# Patient Record
Sex: Male | Born: 1950 | Race: White | Hispanic: No | Marital: Married | Smoking: Never smoker
Health system: Southern US, Community
[De-identification: ages and names within clinical notes are randomized; demographics above are authoritative.]

## PROBLEM LIST (undated history)

## (undated) DIAGNOSIS — I10 Essential (primary) hypertension: Secondary | ICD-10-CM

## (undated) DIAGNOSIS — R0600 Dyspnea, unspecified: Secondary | ICD-10-CM

## (undated) DIAGNOSIS — M1990 Unspecified osteoarthritis, unspecified site: Secondary | ICD-10-CM

## (undated) DIAGNOSIS — R51 Headache: Secondary | ICD-10-CM

## (undated) DIAGNOSIS — R519 Headache, unspecified: Secondary | ICD-10-CM

## (undated) DIAGNOSIS — J189 Pneumonia, unspecified organism: Secondary | ICD-10-CM

## (undated) DIAGNOSIS — E119 Type 2 diabetes mellitus without complications: Secondary | ICD-10-CM

## (undated) HISTORY — PX: JOINT REPLACEMENT: SHX530

## (undated) HISTORY — PX: BACK SURGERY: SHX140

## (undated) HISTORY — PX: REPAIR QUADRICEPS / HAMSTRING MUSCLE: SUR1204

## (undated) HISTORY — PX: OTHER SURGICAL HISTORY: SHX169

---

## 2003-07-12 ENCOUNTER — Ambulatory Visit (HOSPITAL_COMMUNITY): Admission: RE | Admit: 2003-07-12 | Discharge: 2003-07-13 | Payer: Self-pay | Admitting: Neurosurgery

## 2010-07-04 ENCOUNTER — Emergency Department: Payer: Self-pay | Admitting: Unknown Physician Specialty

## 2011-08-04 ENCOUNTER — Ambulatory Visit: Payer: Self-pay | Admitting: Endocrinology

## 2011-09-14 ENCOUNTER — Ambulatory Visit: Payer: Self-pay | Admitting: Internal Medicine

## 2012-10-02 ENCOUNTER — Inpatient Hospital Stay: Payer: Self-pay | Admitting: Internal Medicine

## 2012-10-02 LAB — URINALYSIS, COMPLETE
Blood: NEGATIVE
Glucose,UR: >=500 mg/dL (ref 0–75)
Leukocyte Esterase: NEGATIVE
Protein: NEGATIVE
RBC,UR: NONE SEEN /HPF (ref 0–5)
Squamous Epithelial: <1

## 2012-10-02 LAB — CBC
HCT: 39 % — ABNORMAL LOW (ref 40.0–52.0)
HGB: 13.6 g/dL (ref 13.0–18.0)
MCH: 29.8 pg (ref 26.0–34.0)
MCV: 85 fL (ref 80–100)
Platelet: 224 10*3/uL (ref 150–440)
WBC: 13.5 10*3/uL — ABNORMAL HIGH (ref 3.8–10.6)

## 2012-10-02 LAB — COMPREHENSIVE METABOLIC PANEL
Albumin: 3.4 g/dL (ref 3.4–5.0)
Bilirubin,Total: 0.5 mg/dL (ref 0.2–1.0)
Calcium, Total: 8.3 mg/dL — ABNORMAL LOW (ref 8.5–10.1)
Creatinine: 0.99 mg/dL (ref 0.60–1.30)
EGFR (Non-African Amer.): >60
Osmolality: 289 (ref 275–301)
Potassium: 4.6 mmol/L (ref 3.5–5.1)
SGOT(AST): 21 U/L (ref 15–37)

## 2012-10-03 LAB — CBC WITH DIFFERENTIAL/PLATELET
Basophil %: 0.5 %
Eosinophil %: 0.5 %
HCT: 30.3 % — ABNORMAL LOW (ref 40.0–52.0)
HGB: 11 g/dL — ABNORMAL LOW (ref 13.0–18.0)
Lymphocyte %: 17.8 %
MCH: 30.2 pg (ref 26.0–34.0)
Monocyte #: 0.9 x10 3/mm (ref 0.2–1.0)
Neutrophil #: 7.9 10*3/uL — ABNORMAL HIGH (ref 1.4–6.5)
Neutrophil %: 72.9 %
RBC: 3.65 10*6/uL — ABNORMAL LOW (ref 4.40–5.90)

## 2012-10-03 LAB — BASIC METABOLIC PANEL
BUN: 31 mg/dL — ABNORMAL HIGH (ref 7–18)
Calcium, Total: 7.9 mg/dL — ABNORMAL LOW (ref 8.5–10.1)
Creatinine: 1 mg/dL (ref 0.60–1.30)
EGFR (African American): >60
EGFR (Non-African Amer.): >60
Osmolality: 286 (ref 275–301)
Potassium: 3.6 mmol/L (ref 3.5–5.1)

## 2012-10-04 LAB — CBC WITH DIFFERENTIAL/PLATELET
Basophil #: 0 10*3/uL (ref 0.0–0.1)
Basophil %: 0.5 %
HCT: 29.5 % — ABNORMAL LOW (ref 40.0–52.0)
Monocyte %: 6.8 %
Neutrophil #: 5.7 10*3/uL (ref 1.4–6.5)
RDW: 13.9 % (ref 11.5–14.5)
WBC: 8.5 10*3/uL (ref 3.8–10.6)

## 2012-10-04 LAB — BASIC METABOLIC PANEL WITH GFR
Anion Gap: 2 — ABNORMAL LOW
BUN: 15 mg/dL
Calcium, Total: 8.2 mg/dL — ABNORMAL LOW
Chloride: 111 mmol/L — ABNORMAL HIGH
Co2: 29 mmol/L
Creatinine: 0.95 mg/dL
EGFR (African American): >60
EGFR (Non-African Amer.): >60
Glucose: 111 mg/dL — ABNORMAL HIGH
Osmolality: 285
Potassium: 4.3 mmol/L
Sodium: 142 mmol/L

## 2014-06-07 NOTE — H&P (Signed)
PATIENT NAME:  [name] [name], [name] [name] MR#:  [id number] DATE OF BIRTH:  09/11/1950  DATE OF ADMISSION:  10/02/2012  PRIMARY CARE PHYSICIAN: Dr. Patrecia Pace ED REFERRING PHYSICIAN: Dr. Mayford Knife   CHIEF COMPLAINT: Dark-colored stools.   HISTORY OF PRESENT ILLNESS: The patient is a 64 year old white male with history of hypertension, hyperlipidemia, diabetes, previous history of minimal coronary artery disease who reports that he has been having some abdominal discomfort for the past few days, but then this morning he started having dark-colored stools after having his breakfast. The patient reports that he has had about 4 to 5 bowel movements of dark-colored stool which brought him to the ED.  He had a guaiac which was positive in the ER. The patient does report that he takes 400 mg of Advil at bedtime and one 81 mg aspirin on a daily basis. He has had a history of having problem with having gastric ulcers a long time ago, per his diagnosis, but never had an endoscopy. He reports that he had a colonoscopy done in [place] 3 years ago that was negative. He otherwise denies any hematemesis. He did have an episode of emesis earlier today. He denies any weight loss or weight gain.   PAST MEDICAL HISTORY:  Significant for:  1.  Hypertension.  2.  Hyperlipidemia.  3.  Diabetes, type 2.  4.  Coronary artery disease per cardiac cath done 09/14/2011. At that time, it showed 40% LAD   mid LAD stenosis. Second lesion was a 20% stenosis.   PAST SURGICAL HISTORY: Status post back surgery.   ALLERGIES: None.   CURRENT MEDICATIONS: He is on Apidra SoloStar pen 100 units, sliding scale Byetta 10 mcg subcutaneous b.i.d., doxazosin 8 daily, hydrochlorothiazide/losartan 12.5/50 daily, Janumet 100/50, 1 tab p.o. b.i.d., Lantus 60 units at bedtime, phentermine 37.5 mg daily.   SOCIAL HISTORY: Does not smoke. Does not drink. No drugs.   FAMILY HISTORY: Positive for hypertension and diabetes.   REVIEW OF SYSTEMS:    CONSTITUTIONAL: Denies any fevers, fatigue. No weakness. Mild abdominal discomfort. No weight loss. No weight gain.  EYES: No blurred or double vision. No pain. No redness. No inflammation. No glaucoma. No cataracts.  ENT: No tinnitus. No ear pain. No hearing loss. No seasonal or year-round allergies. No epistaxis.  No nasal discharge. No difficulty swallowing.  RESPIRATORY: Denies any cough, wheezing, hemoptysis. No dyspnea. No COPD.  CARDIOVASCULAR: Denies any chest pain, orthopnea, edema or arrhythmia. No palpitations. No syncope.  GASTROINTESTINAL: No nausea, vomiting, diarrhea. Complains of abdominal pressure.  No hematemesis. Complains of dark-colored stools. No jaundice. No changes in bowel habits.  GENITOURINARY: Denies any dysuria, hematuria, renal calculus or frequency.  ENDOCRINE: Denies any polyuria, nocturia or thyroid problems.  HEMATOLOGIC/LYMPHATIC:  Denies any anemia, easy bruisability or bleeding.  SKIN: No acne. No rash. No changes in mole, hair or skin.  MUSCULOSKELETAL: Denies any pain in the neck, back or shoulder.  NEUROLOGIC: No numbness. No CVA. No transient ischemic attack. No seizures.  PSYCHIATRIC: No anxiety. No insomnia. No ADD.   PHYSICAL EXAMINATION: VITAL SIGNS: Temperature 98.5, pulse 103, respirations 20, blood pressure 146/77, O2 97%.  GENERAL: The patient is an obese male in no acute distress.  HEAD, EYES, EARS, NOSE, THROAT: Head atraumatic, normocephalic. Pupils equally round, reactive to light and accommodation. There is no conjunctival pallor. No scleral icterus. Nasal exam shows no drainage or ulceration. Oropharynx is clear without any exudate.  NECK: Supple without any JVD.  CARDIOVASCULAR: Regular rate and rhythm. No  murmurs, rubs, clicks or gallops. PMI is not displaced.  LUNGS: Clear to auscultation bilaterally without any rales, rhonchi, wheezing.  ABDOMEN: Soft, nontender, nondistended. Positive bowel sounds x 4. There is no guarding. No  rebound. No tenderness.  EXTREMITIES: No clubbing, cyanosis edema.  SKIN: No rash.  LYMPHATICS: No lymph nodes palpable.  VASCULAR: Good DP, PT pulses.  PSYCHIATRIC: Not anxious or depressed.  NEUROLOGICAL: Awake, alert, oriented x 3. No focal deficits.  VASCULAR: Good DP, PT pulses.  LYMPH NODES: No lymph nodes palpable.  NEUROLOGICAL: Cranial nerves II through XII are grossly intact. No focal deficits.   LABORATORY DATA: Glucose 264, BUN 41, creatinine 0.99, sodium 135, potassium 4.6, chloride 104, CO2 is 27, calcium 8.3. LFTs are normal. Troponin less than 0.02. WBC 13.5, hemoglobin 13.6, platelet count 224.   ASSESSMENT/PLAN:  The patient is a 64 year old white male with history of hypertension, diabetes, hyperlipidemia and nonobstructive coronary artery disease, presents with dark-colored stools since this morning, has had 5 bowel movements.   1.  Likely upper GI bleed, possibly NSAID-induced:  At this time, I will go ahead and give him IV Protonix bolus. I have spoken to Dr. Servando SnareWohl, who will have his PA see him in the morning. The patient will need EGD. We will follow his H and H and transfuse him as needed.  2.  Hypertension:  Blood pressure is currently normal.  I will hold his antihypertensives. 3.  Diabetes: We will place him on sliding scale, hold his insulin in light of patient being n.p.o. after midnight and being on a clear liquid diet.  4.  Miscellaneous: We will place him on SCDs for DVT prophylaxis.   NOTE: 50 minutes spent on H and P.     ____________________________ Lacie ScottsShreyang H. Allena KatzPatel, MD shp:cb D: 10/02/2012 19:54:58 ET T: 10/02/2012 21:30:19 ET JOB#: [id number]  cc: Anaih Brander H. Allena KatzPatel, MD, <Dictator> Charise CarwinSHREYANG H Kahley Leib MD ELECTRONICALLY SIGNED 10/09/2012 13:12

## 2014-06-07 NOTE — Consult Note (Signed)
PATIENT NAME:  [name], [name] MR#:  [id number] DATE OF BIRTH:  07-Jul-1950  DATE OF CONSULTATION:  10/03/2012  GASTROENTEROLOGY CONSULTATION  REQUESTING PHYSICIAN:  Dr. Auburn Bilberry.  GASTROENTEROLOGIST: Dr. Midge Minium.  PRIMARY CARE PHYSICIAN:  Dr. Patrecia Pace.  REASON FOR CONSULTATION: Melena.   HISTORY OF PRESENT ILLNESS: [name] [name] is a 64 year old Caucasian male who was in his usual state of health until yesterday at 6 a.m. when he awakened and had melena. He denies any abdominal pain. He had multiple bowel movement which went from black to burgundy-colored. He denies any NSAIDs, although he does take an aspirin 81 mg daily, and has in the past taken Advil 400 mg at bedtime.   He denies any heartburn or indigestion. Denies any dysphagia or odynophagia. Denies any anorexia. He has been on a weight loss pill and has lost about 15 pounds in the last month under the direction of his physician. His last colonoscopy was in [place] 3 years ago. He reports he had some small polyps, but is unsure when he is supposed to follow up for a repeat colonoscopy. His hemoglobin dropped from 13.6 on admission to 11 today.   PAST MEDICAL AND SURGICAL HISTORY: Hypertension, hyperlipidemia, diabetes mellitus, coronary artery disease status post cath 09/14/2011, back surgery, and right knee and quadriceps surgery.   MEDICATIONS PRIOR TO ADMISSION: Apidra, by sliding-scale pen, Byetta 10 mcg/ 0.04 mL, one subcutaneous b.i.d., doxazosin 8 mg daily, Janumet 1 gram/50 mg 1 tablet b.i.d., Lantus insulin 60 units subcutaneous at dinner, phentermine 37.5 mg daily.   ALLERGIES: No known drug allergies.   FAMILY HISTORY: There is no family history of colorectal carcinoma, liver or chronic GI problems.   There is a family history positive for hypertension, diabetes mellitus.   SOCIAL HISTORY: He is married. He has 4 healthy children. 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 He denies tobacco, alcohol or illicit drug use.   REVIEW OF SYSTEMS: See HPI, otherwise negative 12-point review of systems.   PHYSICAL EXAMINATION: VITAL SIGNS: Temperature 98.4, pulse 74, respirations 19, blood pressure 127/71, oxygen saturations 95% on room air.  GENERAL: He is an obese Caucasian male who is alert, oriented, pleasant and cooperative. No acute distress.  HEENT: Sclerae clear. Anicteric. Conjunctivae pink. Oropharynx pink and moist, without any lesions.  NECK: Supple, without any mass or thyromegaly.  CHEST: Heart regular rate and rhythm. Normal S1, S2, without murmurs, clicks, rubs or gallops.  LUNGS: Clear to auscultation bilaterally.  ABDOMEN: Protuberant. Positive bowel sounds x 4. No bruits auscultated. Abdomen is soft, nontender, nondistended, without palpable masses or splenomegaly. Exam is limited due to  patient's body habitus.  EXTREMITIES: Without edema or clubbing bilaterally.  SKIN: Pink, warm and dry.  NEUROLOGIC: Is grossly intact.  PSYCHIATRIC: He is alert, pleasant, cooperative. Normal mood and affect.  MUSCULOSKELETAL: Good, equal movement and strength bilaterally.   LABORATORY STUDIES: White blood cell count of 10.8, hematocrit 30.3, MCV 83, glucose 154, BUN 31, anion gap 2, calcium 7.9; otherwise normal basic metabolic panel.   LFTs are normal. Platelet count is normal.   IMPRESSION: [name] [name] is a 64 year old Caucasian male who was admitted with an acute GI bleed in the way of melena and burgundy blood.   I suspect upper gastrointestinal source from peptic ulcer disease, Dieulafoy, or small bowel source such as AVM. EGD with Dr. Servando Snare today. I discussed risks and benefits including, but not limited to: Bleeding,  infection, perforation, drug reaction. He agrees with this plan and consent will be obtained.   PLAN: 1.  EGD today.  2.  N.p.o.  3.  Agree with PPI.   We would like to thank you for allowing [name] [name] to participate in  the care of [name] [name].   ____________________________ Joselyn ArrowKandice L. Vella Colquitt, NP klj:dm D: 10/03/2012 13:21:28 ET T: 10/03/2012 14:16:18 ET JOB#: [id number]  cc: Joselyn ArrowKandice L. Isa Hitz, NP, <Dictator> Alan MulderShamil J. Morayati, MD  Joselyn ArrowKANDICE L Anedra Penafiel FNP ELECTRONICALLY SIGNED 10/30/2012 13:07

## 2014-06-07 NOTE — Discharge Summary (Signed)
PATIENT NAME:  [name] [name], Juandavid L MR#:  [id number] DATE OF BIRTH:  Sep 03, 1950  DATE OF ADMISSION:  10/02/2012 DATE OF DISCHARGE:  10/04/2012  PRESENTING COMPLAINT: Dark-colored stools.   DISCHARGE DIAGNOSES: 1.  Melena/gastrointestinal bleed secondary to peptic ulcer disease.  2.  Gastric and duodenal ulcers on esophagogastroduodenoscopy.  3.  Type 2 diabetes.  4.  Hypertension.   PROCEDURES: EGD showed normal esophagus, gastric ulcer with clean base. One duodenal ulcer containing visible vessel. Injected, treated with thermal therapy. This was done by Dr. Servando SnareWohl, GI.   CONSULTANTS:  GI - Dr. Midge Miniumarren Wohl.   DISCHARGE MEDICATIONS: 1.  Byetta 10 mcg 1 sub-Q b.i.d.  2.  Doxazosin 8 mg daily. 3.  Phentermine 37.5 mg p.o. daily.  4.  Apidra SoloSTAR sub-Q sliding scale.  5.  Lantus 60 units at dinnertime daily.  6.  Janumet 1000/50 mg 1 p.o. b.i.d.  7.  Protonix 40 mg b.i.d.   DISCHARGE DIET:  Carbohydrate-controlled, mechanical soft.  DISCHARGE FOLLOWUP:  1.  With Dr. Johny ChessMoriarty 08/21.   2.  With Dr. Servando SnareWohl in 2 to 4 weeks.  LABORATORY DATA: White count is 8.5, H and H 10.6 and 29.5. Basic metabolic panel within normal limits.   BRIEF SUMMARY OF HOSPITAL COURSE: [name] is a 64 year old Caucasian gentleman with history of type 2 diabetes who comes in with:  1.  Melena/GI bleed. The patient was kept n.p.o., started on IV fluids and IV Protonix drip. He does use NSAIDs on and off for arthritis. The patient underwent an EGD that showed gastric ulcers, nonbleeding, along with duodenal ulcer that was injected and treated with thermal therapy. Hemoglobin remained stable. The patient tolerated a soft diet prior to discharge. He will continue PPI b.i.d. and follow up with Dr. Servando SnareWohl as outpatient.  2.  Hypertension, well controlled. The patient is not on any medication, but takes doxazosin.  3.  Type 2 diabetes. Continued home diabetic meds. He will follow up with Dr. Johny ChessMoriarty on 08/21, his scheduled  appointment.   Hospital stay otherwise remained stable. The patient remained a FULL CODE.   TIME SPENT: 40 minutes.  ____________________________ Wylie HailSona A. Allena KatzPatel, MD sap:sb D: 10/05/2012 07:41:37 ET T: 10/05/2012 07:58:40 ET JOB#: [id number]  cc: Deavion Dobbs A. Allena KatzPatel, MD, <Dictator> Midge Miniumarren Wohl, MD Alan MulderShamil J. Morayati, MD  Willow OraSONA A Patricia Perales MD ELECTRONICALLY SIGNED 10/19/2012 20:01

## 2014-06-07 NOTE — Consult Note (Signed)
Chief Complaint:  Subjective/Chief Complaint Pt denies melena, abdominal pain, nausea or vomiting.  Tolerating clears well.  Hgb 10.6.   VITAL SIGNS/ANCILLARY NOTES: **Vital Signs.:   20-Aug-14 06:03  Vital Signs Type Routine  Temperature Temperature (F) 98.6  Celsius 37  Temperature Source oral  Pulse Pulse 65  Respirations Respirations 19  Systolic BP Systolic BP 579  Diastolic BP (mmHg) Diastolic BP (mmHg) 78  Mean BP 91  Pulse Ox % Pulse Ox % 94  Pulse Ox Activity Level  At rest  Oxygen Delivery Room Air/ 21 %   Brief Assessment:  GEN well developed, well nourished, no acute distress   Cardiac Regular   Respiratory normal resp effort   Gastrointestinal details normal Soft  Nontender  Nondistended  Bowel sounds normal  No rebound tenderness  No gaurding   EXTR negative edema   Additional Physical Exam Skin: pink, warm, dry   Lab Results: Routine Chem:  20-Aug-14 04:52   Glucose, Serum  111  Creatinine (comp) 0.95  Sodium, Serum 142  Potassium, Serum 4.3  Chloride, Serum  111  CO2, Serum 29  Calcium (Total), Serum  8.2  Anion Gap  2  Osmolality (calc) 285  eGFR (African American) >60  eGFR (Non-African American) >60 (eGFR values <52m/min/1.73 m2 may be an indication of chronic kidney disease (CKD). Calculated eGFR is useful in patients with stable renal function. The eGFR calculation will not be reliable in acutely ill patients when serum creatinine is changing rapidly. It is not useful in  patients on dialysis. The eGFR calculation may not be applicable to patients at the low and high extremes of body sizes, pregnant women, and vegetarians.)  Routine Hem:  20-Aug-14 04:52   WBC (CBC) 8.5  RBC (CBC)  3.50  Hemoglobin (CBC)  10.6  Hematocrit (CBC)  29.5  Platelet Count (CBC) 184  MCV 84  MCH 30.3  MCHC 35.9  RDW 13.9  Neutrophil % 66.9  Lymphocyte % 24.6  Monocyte % 6.8  Eosinophil % 1.2  Basophil % 0.5  Neutrophil # 5.7  Lymphocyte # 2.1   Monocyte # 0.6  Eosinophil # 0.1  Basophil # 0.0 (Result(s) reported on 04 Oct 2012 at 05:39AM.)   Assessment/Plan:  Assessment/Plan:  Assessment UGI bleed secondary to duodenal ulcer & PUD:  H pylori serology pending. On PPI.  s/p EGD with intervention including thermal/injection. Anemia: stable sec to GI bleed   Plan 1) Change to PO Protonix 4105mQD 2) FU H pylori 3) Advance to GERD diet-discussed with pt 4) FU in 2 months with usKoreab[place] advised to call sooner if any bleeding or problems   Electronic Signatures: JoAndria MeuseNP)  (Signed 20-Aug-14 09:22)  Authored: Chief Complaint, VITAL SIGNS/ANCILLARY NOTES, Brief Assessment, Lab Results, Assessment/Plan   Last Updated: 20-Aug-14 09:22 by JoAndria MeuseNP)

## 2014-06-07 NOTE — Consult Note (Signed)
Brief Consult Note: Diagnosis: Melena, anemia.   Patient was seen by consultant.   Consult note dictated.   Comments: [name] [name] is a pleasant 64 y/o male with melena & anemia, suspect upper GI bleeding from PUD, Dieulefoy,  or small bowel source such as AVM.  EGD with Dr Servando SnareWohl today.   Plan: 1) EGD 2) NPO 3) Agree with PPI Thanks for consult. Please see full dictated note.  #[id number].  Electronic Signatures: Joselyn ArrowJones, Tennyson Kallen L (NP)  (Signed 19-Aug-14 13:21)  Authored: Brief Consult Note   Last Updated: 19-Aug-14 13:21 by Joselyn ArrowJones, Kiante Petrovich L (NP)

## 2016-06-22 ENCOUNTER — Other Ambulatory Visit: Payer: Self-pay | Admitting: Endocrinology

## 2016-06-22 DIAGNOSIS — Z01810 Encounter for preprocedural cardiovascular examination: Secondary | ICD-10-CM

## 2016-06-22 DIAGNOSIS — I2510 Atherosclerotic heart disease of native coronary artery without angina pectoris: Secondary | ICD-10-CM

## 2016-06-24 ENCOUNTER — Ambulatory Visit
Admission: RE | Admit: 2016-06-24 | Discharge: 2016-06-24 | Disposition: A | Discharge status: Home / Self Care | Payer: Managed Care, Other (non HMO) | Source: Ambulatory Visit | Attending: Endocrinology | Admitting: Endocrinology

## 2016-06-24 DIAGNOSIS — I2510 Atherosclerotic heart disease of native coronary artery without angina pectoris: Secondary | ICD-10-CM | POA: Insufficient documentation

## 2016-06-24 DIAGNOSIS — Z01810 Encounter for preprocedural cardiovascular examination: Secondary | ICD-10-CM

## 2016-06-24 NOTE — Progress Notes (Signed)
*  PRELIMINARY RESULTS* Echocardiogram 2D Echocardiogram has been performed.  Cristela BlueHege, Kimorah Ridolfi 06/24/2016, 10:22 AM

## 2016-06-30 NOTE — H&P (Signed)
TOTAL HIP ADMISSION H&P  Patient is admitted for right total hip arthroplasty, anterior approach.  Subjective:  Chief Complaint:    Right hip primary OA / pain  HPI: [name] [name], 66 y.o. male, has a history of pain and functional disability in the right hip(s) due to arthritis and patient has failed non-surgical conservative treatments for greater than 12 weeks to include NSAID's and/or analgesics, corticosteriod injections, supervised PT with diminished ADL's post treatment and activity modification.  Onset of symptoms was gradual starting ~4 years ago with gradually worsening course since that time.The patient noted no past surgery on the right hip(s).  Patient currently rates pain in the right hip at 7 out of 10 with activity. Patient has night pain, worsening of pain with activity and weight bearing, trendelenberg gait, pain that interfers with activities of daily living and pain with passive range of motion. Patient has evidence of periarticular osteophytes and joint space narrowing by imaging studies. This condition presents safety issues increasing the risk of falls.   There is no current active infection.   Risks, benefits and expectations were discussed with the patient.  Risks including but not limited to the risk of anesthesia, blood clots, nerve damage, blood vessel damage, failure of the prosthesis, infection and up to and including death.  Patient understand the risks, benefits and expectations and wishes to proceed with surgery.   PCP: Alan Mulder, MD  D/C Plans:       Home  Post-op Meds:       No Rx given   Tranexamic Acid:      To be given - IV  Decadron:      Is to be given  FYI:     Xarelto (unable to tolerate ASA/NSAIDs bleeding ulcers)  Norco  DME:   Pt already has equipment   PT:   No PT    Past Medical History:  Diagnosis Date  . Arthritis   . Diabetes mellitus without complication (HCC)    type 2  . Dyspnea    with excertion  . Headache    rare   . Hypertension   . Pneumonia    as a child    Past Surgical History:  Procedure Laterality Date  . BACK SURGERY     2005  . cartarized     bleeding ulcer  2015  . JOINT REPLACEMENT     Right total hip 5/29 2018  . REPAIR QUADRICEPS / HAMSTRING MUSCLE     torn loose from knee 2009 5/25    No prescriptions prior to admission.   No Known Allergies   Social History  Substance Use Topics  . Smoking status: Never Smoker  . Smokeless tobacco: Never Used  . Alcohol use No       Review of Systems  Constitutional: Negative.   HENT: Negative.   Eyes: Negative.   Respiratory: Negative.   Cardiovascular: Negative.   Gastrointestinal: Negative.   Genitourinary: Negative.   Musculoskeletal: Positive for joint pain.  Skin: Negative.   Neurological: Negative.   Endo/Heme/Allergies: Negative.   Psychiatric/Behavioral: Negative.     Objective:  Physical Exam  Constitutional: He is oriented to person, place, and time. He appears well-developed.  HENT:  Head: Normocephalic.  Eyes: Pupils are equal, round, and reactive to light.  Neck: Neck supple. No JVD present. No tracheal deviation present. No thyromegaly present.  Cardiovascular: Normal rate, regular rhythm and intact distal pulses.   Respiratory: Effort normal and breath sounds normal.  No respiratory distress. He has no wheezes.  GI: Soft. There is no tenderness. There is no guarding.  Musculoskeletal:       Right hip: He exhibits decreased range of motion, decreased strength, tenderness and bony tenderness. He exhibits no swelling, no deformity and no laceration.  Lymphadenopathy:    He has no cervical adenopathy.  Neurological: He is alert and oriented to person, place, and time.  Skin: Skin is warm and dry.  Psychiatric: He has a normal mood and affect.       Imaging Review Plain radiographs demonstrate severe degenerative joint disease of the right hip(s). The bone quality appears to be good for age and  reported activity level.  Assessment/Plan:  End stage arthritis, right hip(s)  The patient history, physical examination, clinical judgement of the provider and imaging studies are consistent with end stage degenerative joint disease of the right hip(s) and total hip arthroplasty is deemed medically necessary. The treatment options including medical management, injection therapy, arthroscopy and arthroplasty were discussed at length. The risks and benefits of total hip arthroplasty were presented and reviewed. The risks due to aseptic loosening, infection, stiffness, dislocation/subluxation,  thromboembolic complications and other imponderables were discussed.  The patient acknowledged the explanation, agreed to proceed with the plan and consent was signed. Patient is being admitted for inpatient treatment for surgery, pain control, PT, OT, prophylactic antibiotics, VTE prophylaxis, progressive ambulation and ADL's and discharge planning.The patient is planning to be discharged home.      Anastasio AuerbachMatthew S. Lil Lepage   PA-C  07/08/2016, 6:16 PM

## 2016-07-07 NOTE — Patient Instructions (Addendum)
[name] [name]  07/07/2016   Your procedure is scheduled on: 07/13/16  [facility]  [facility]tr[facility]                Follow signs to Short Stay on first floor at     0515 AM   Call this number if you have problems the morning of surgery  [phone]   Remember: ONLY 1 PERSON MAY GO WITH YOU TO SHORT STAY TO GET  READY MORNING OF YOUR SURGERY.  Do not eat food or drink liquids :After Midnight.     Take these medicines the morning of surgery with A SIP OF WATER: clonodine, doxazosin(cardura) DO NOT TAKE ANY  ORAL DIABETIC MEDICATIONS DAY OF YOUR SURGERY                               You may not have any metal on your body including hair pins and              piercings  Do not wear jewelry,lotions, powders or perfumes, deodorant                        Men may shave face and neck.   Do not bring valuables to the hospital. [facility] IS NOT             RESPONSIBLE   FOR VALUABLES.  Contacts, dentures or bridgework may not be worn into surgery.  Leave suitcase in the car. After surgery it may be brought to your room.                 Please read over the following fact sheets you were given: _____________________________________________________________________           [facility] - Preparing [facility] Before surgery, you can play an important role.  Because skin is not sterile, your skin needs to be as free of germs as possible.  You can reduce the number of germs on your skin by washing with CHG (chlorahexidine gluconate) soap before surgery.  CHG is an antiseptic cleaner which kills germs and bonds with the skin to continue killing germs even after washing. Please DO NOT use if you have an allergy to CHG or antibacterial soaps.  If your skin becomes reddened/irritated stop using the CHG and inform your nurse when you arrive at Short Stay. Do not shave (including legs and underarms) for at least 48 hours prior to the first CHG shower.  You may shave  your face/neck. Please follow these instructions carefully:  1.  Shower with CHG Soap the night before surgery and the  morning of Surgery.  2.  If you choose to wash your hair, wash your hair first as usual with your  normal  shampoo.  3.  After you shampoo, rinse your hair and body thoroughly to remove the  shampoo.                           4.  Use CHG as you would any other liquid soap.  You can apply chg directly  to the skin and wash                       Gently with a scrungie or clean  washcloth.  5.  Apply the CHG Soap to your body ONLY FROM THE NECK DOWN.   Do not use on face/ open                           Wound or open sores. Avoid contact with eyes, ears mouth and genitals (private parts).                       Wash face,  Genitals (private parts) with your normal soap.             6.  Wash thoroughly, paying special attention to the area where your surgery  will be performed.  7.  Thoroughly rinse your body with warm water from the neck down.  8.  DO NOT shower/wash with your normal soap after using and rinsing off  the CHG Soap.                9.  Pat yourself dry with a clean towel.            10.  Wear clean pajamas.            11.  Place clean sheets on your bed the night of your first shower and do not  sleep with pets. Day of Surgery : Do not apply any lotions/deodorants the morning of surgery.  Please wear clean clothes to the hospital/surgery center.  FAILURE TO FOLLOW THESE INSTRUCTIONS MAY RESULT IN THE CANCELLATION OF YOUR SURGERY PATIENT SIGNATURE_________________________________  NURSE SIGNATURE__________________________________  ________________________________________________________________________  WHAT IS A BLOOD TRANSFUSION? Blood Transfusion Information  A transfusion is the replacement of blood or some of its parts. Blood is made up of multiple cells which provide different functions.  Red blood cells carry oxygen and are used for blood loss  replacement.  White blood cells fight against infection.  Platelets control bleeding.  Plasma helps clot blood.  Other blood products are available for specialized needs, such as hemophilia or other clotting disorders. BEFORE THE TRANSFUSION  Who gives blood for transfusions?   Healthy volunteers who are fully evaluated to make sure their blood is safe. This is blood bank blood. Transfusion therapy is the safest it has ever been in the practice of medicine. Before blood is taken from a donor, a complete history is taken to make sure that person has no history of diseases nor engages in risky social behavior (examples are intravenous drug use or sexual activity with multiple partners). The donor's travel history is screened to minimize risk of transmitting infections, such as malaria. The donated blood is tested for signs of infectious diseases, such as HIV and hepatitis. The blood is then tested to be sure it is compatible with you in order to minimize the chance of a transfusion reaction. If you or a relative donates blood, this is often done in anticipation of surgery and is not appropriate for emergency situations. It takes many days to process the donated blood. RISKS AND COMPLICATIONS Although transfusion therapy is very safe and saves many lives, the main dangers of transfusion include:   Getting an infectious disease.  Developing a transfusion reaction. This is an allergic reaction to something in the blood you were given. Every precaution is taken to prevent this. The decision to have a blood transfusion has been considered carefully by your caregiver before blood is given. Blood is not given unless the benefits outweigh the risks. AFTER THE TRANSFUSION  Right after receiving a blood transfusion, you will usually feel much better and more energetic. This is especially true if your red blood cells have gotten low (anemic). The transfusion raises the level of the red blood cells which  carry oxygen, and this usually causes an energy increase.  The nurse administering the transfusion will monitor you carefully for complications. HOME CARE INSTRUCTIONS  No special instructions are needed after a transfusion. You may find your energy is better. Speak with your caregiver about any limitations on activity for underlying diseases you may have. SEEK MEDICAL CARE IF:   Your condition is not improving after your transfusion.  You develop redness or irritation at the intravenous (IV) site. SEEK IMMEDIATE MEDICAL CARE IF:  Any of the following symptoms occur over the next 12 hours:  Shaking chills.  You have a temperature by mouth above 102 F (38.9 C), not controlled by medicine.  Chest, back, or muscle pain.  People around you feel you are not acting correctly or are confused.  Shortness of breath or difficulty breathing.  Dizziness and fainting.  You get a rash or develop hives.  You have a decrease in urine output.  Your urine turns a dark color or changes to pink, red, or brown. Any of the following symptoms occur over the next 10 days:  You have a temperature by mouth above 102 F (38.9 C), not controlled by medicine.  Shortness of breath.  Weakness after normal activity.  The white part of the eye turns yellow (jaundice).  You have a decrease in the amount of urine or are urinating less often.  Your urine turns a dark color or changes to pink, red, or brown. Document Released: 01/30/2000 Document Revised: 04/26/2011 Document Reviewed: 09/18/2007 ExitCare Patient Information 2014 [place].  _______________________________________________________________________  Incentive Spirometer  An incentive spirometer is a tool that can help keep your lungs clear and active. This tool measures how well you are filling your lungs with each breath. Taking long deep breaths may help reverse or decrease the chance of developing breathing (pulmonary) problems  (especially infection) following:  A long period of time when you are unable to move or be active. BEFORE THE PROCEDURE   If the spirometer includes an indicator to show your best effort, your nurse or respiratory therapist will set it to a desired goal.  If possible, sit up straight or lean slightly forward. Try not to slouch.  Hold the incentive spirometer in an upright position. INSTRUCTIONS FOR USE  1. Sit on the edge of your bed if possible, or sit up as far as you can in bed or on a chair. 2. Hold the incentive spirometer in an upright position. 3. Breathe out normally. 4. Place the mouthpiece in your mouth and seal your lips tightly around it. 5. Breathe in slowly and as deeply as possible, raising the piston or the ball toward the top of the column. 6. Hold your breath for 3-5 seconds or for as long as possible. Allow the piston or ball to fall to the bottom of the column. 7. Remove the mouthpiece from your mouth and breathe out normally. 8. Rest for a few seconds and repeat Steps 1 through 7 at least 10 times every 1-2 hours when you are awake. Take your time and take a few normal breaths between deep breaths. 9. The spirometer may include an indicator to show your best effort. Use the indicator as a goal to work toward during each repetition. 10. After  each set of 10 deep breaths, practice coughing to be sure your lungs are clear. If you have an incision (the cut made at the time of surgery), support your incision when coughing by placing a pillow or rolled up towels firmly against it. Once you are able to get out of bed, walk around indoors and cough well. You may stop using the incentive spirometer when instructed by your caregiver.  RISKS AND COMPLICATIONS  Take your time so you do not get dizzy or light-headed.  If you are in pain, you may need to take or ask for pain medication before doing incentive spirometry. It is harder to take a deep breath if you are having  pain. AFTER USE  Rest and breathe slowly and easily.  It can be helpful to keep track of a log of your progress. Your caregiver can provide you with a simple table to help with this. If you are using the spirometer at home, follow these instructions: SEEK MEDICAL CARE IF:   You are having difficultly using the spirometer.  You have trouble using the spirometer as often as instructed.  Your pain medication is not giving enough relief while using the spirometer.  You develop fever of 100.5 F (38.1 C) or higher. SEEK IMMEDIATE MEDICAL CARE IF:   You cough up bloody sputum that had not been present before.  You develop fever of 102 F (38.9 C) or greater.  You develop worsening pain at or near the incision site. MAKE SURE YOU:   Understand these instructions.  Will watch your condition.  Will get help right away if you are not doing well or get worse. Document Released: 06/14/2006 Document Revised: 04/26/2011 Document Reviewed: 08/15/2006 ExitCare Patient Information 2014 ExitCare, [place].   ________________________________________________________________________ How to Manage Your Diabetes Before and After Surgery  Why is it important to control my blood sugar before and after surgery? . Improving blood sugar levels before and after surgery helps healing and can limit problems. . A way of improving blood sugar control is eating a healthy diet by: o  Eating less sugar and carbohydrates o  Increasing activity/exercise o  Talking with your doctor about reaching your blood sugar goals . High blood sugars (greater than 180 mg/dL) can raise your risk of infections and slow your recovery, so you will need to focus on controlling your diabetes during the weeks before surgery. . Make sure that the doctor who takes care of your diabetes knows about your planned surgery including the date and location.  How do I manage my blood sugar before surgery? . Check your blood sugar at  least 4 times a day, starting 2 days before surgery, to make sure that the level is not too high or low. o Check your blood sugar the morning of your surgery when you wake up and every 2 hours until you get to the Short Stay unit. . If your blood sugar is less than 70 mg/dL, you will need to treat for low blood sugar: o Do not take insulin. o Treat a low blood sugar (less than 70 mg/dL) with  cup of clear juice (cranberry or apple), 4 glucose tablets, OR glucose gel. o Recheck blood sugar in 15 minutes after treatment (to make sure it is greater than 70 mg/dL). If your blood sugar is not greater than 70 mg/dL on recheck, call [phone] for further instructions. . Report your blood sugar to the short stay nurse when you get to Short Stay.  . If  you are admitted to the hospital after surgery: o Your blood sugar will be checked by the staff and you will probably be given insulin after surgery (instead of oral diabetes medicines) to make sure you have good blood sugar levels. o The goal for blood sugar control after surgery is 80-180 mg/dL.   WHAT DO I DO ABOUT MY DIABETES MEDICATION?  Marland Kitchen Do not take oral diabetes medicines (pills) the morning of surgery.  . THE NIGHT BEFORE SURGERY, take   50 % of Lantus    Insulin dose.   (25 units)       . THE MORNING OF SURGERY, take  0 units of      0   insulin.  . The day of surgery, do not take other diabetes injectables, including Byetta (exenatide), Bydureon (exenatide ER), Victoza (liraglutide), or Trulicity (dulaglutide).  . If your CBG is greater than 220 mg/dL, you may take  of your sliding scale  . (correction) dose of insulin.      Patient Signature:  Date:   Nurse Signature:  Date:   Reviewed and Endorsed by [facility] Patient Education Committee, August 2015

## 2016-07-08 ENCOUNTER — Encounter (HOSPITAL_COMMUNITY): Payer: Self-pay

## 2016-07-08 ENCOUNTER — Encounter (HOSPITAL_COMMUNITY)
Admission: RE | Admit: 2016-07-08 | Discharge: 2016-07-08 | Disposition: A | Discharge status: Home / Self Care | Payer: Managed Care, Other (non HMO) | Source: Ambulatory Visit | Attending: Orthopedic Surgery | Admitting: Orthopedic Surgery

## 2016-07-08 DIAGNOSIS — Z01812 Encounter for preprocedural laboratory examination: Secondary | ICD-10-CM | POA: Insufficient documentation

## 2016-07-08 DIAGNOSIS — M1611 Unilateral primary osteoarthritis, right hip: Secondary | ICD-10-CM | POA: Diagnosis not present

## 2016-07-08 DIAGNOSIS — Z0183 Encounter for blood typing: Secondary | ICD-10-CM | POA: Diagnosis not present

## 2016-07-08 HISTORY — DX: Essential (primary) hypertension: I10

## 2016-07-08 HISTORY — DX: Headache: R51

## 2016-07-08 HISTORY — DX: Pneumonia, unspecified organism: J18.9

## 2016-07-08 HISTORY — DX: Headache, unspecified: R51.9

## 2016-07-08 HISTORY — DX: Dyspnea, unspecified: R06.00

## 2016-07-08 HISTORY — DX: Unspecified osteoarthritis, unspecified site: M19.90

## 2016-07-08 HISTORY — DX: Type 2 diabetes mellitus without complications: E11.9

## 2016-07-08 LAB — SURGICAL PCR SCREEN
MRSA, PCR: NEGATIVE
Staphylococcus aureus: NEGATIVE

## 2016-07-08 LAB — GLUCOSE, CAPILLARY: GLUCOSE-CAPILLARY: 167 mg/dL — AB (ref 65–99)

## 2016-07-08 LAB — ABO/RH: ABO/RH(D): O POS

## 2016-07-08 NOTE — Progress Notes (Signed)
Clearance 06/15/16 chart lov 05/10/16 chart  06/22/16 ekg chart  cbc/cmp 06/22/16 chart  Echo 06/24/16 epic

## 2016-07-09 LAB — HEMOGLOBIN A1C
Hgb A1c MFr Bld: 7.1 % — ABNORMAL HIGH (ref 4.8–5.6)
Mean Plasma Glucose: 157 mg/dL

## 2016-07-12 MED ORDER — DEXTROSE 5 % IV SOLN
3.0000 g | INTRAVENOUS | Status: AC
  Administered 2016-07-13: 3 g via INTRAVENOUS
  Filled 2016-07-12: qty 3

## 2016-07-13 ENCOUNTER — Ambulatory Visit (HOSPITAL_COMMUNITY): Payer: Managed Care, Other (non HMO) | Admitting: Anesthesiology

## 2016-07-13 ENCOUNTER — Ambulatory Visit (HOSPITAL_COMMUNITY): Payer: Managed Care, Other (non HMO)

## 2016-07-13 ENCOUNTER — Inpatient Hospital Stay (HOSPITAL_COMMUNITY): Payer: Managed Care, Other (non HMO)

## 2016-07-13 ENCOUNTER — Encounter (HOSPITAL_COMMUNITY): Payer: Self-pay

## 2016-07-13 ENCOUNTER — Encounter (HOSPITAL_COMMUNITY)
Admission: AD | Disposition: A | Discharge status: Home / Self Care | Payer: Self-pay | Source: Ambulatory Visit | Attending: Orthopedic Surgery

## 2016-07-13 ENCOUNTER — Inpatient Hospital Stay (HOSPITAL_COMMUNITY)
Admission: AD | Admit: 2016-07-13 | Discharge: 2016-07-14 | DRG: 470 | Disposition: A | Discharge status: Home / Self Care | Payer: Managed Care, Other (non HMO) | Source: Ambulatory Visit | Attending: Orthopedic Surgery | Admitting: Orthopedic Surgery

## 2016-07-13 DIAGNOSIS — Z96649 Presence of unspecified artificial hip joint: Secondary | ICD-10-CM

## 2016-07-13 DIAGNOSIS — Z794 Long term (current) use of insulin: Secondary | ICD-10-CM | POA: Diagnosis not present

## 2016-07-13 DIAGNOSIS — Z79899 Other long term (current) drug therapy: Secondary | ICD-10-CM | POA: Diagnosis not present

## 2016-07-13 DIAGNOSIS — Z6830 Body mass index (BMI) 30.0-30.9, adult: Secondary | ICD-10-CM

## 2016-07-13 DIAGNOSIS — E669 Obesity, unspecified: Secondary | ICD-10-CM | POA: Diagnosis present

## 2016-07-13 DIAGNOSIS — E119 Type 2 diabetes mellitus without complications: Secondary | ICD-10-CM | POA: Diagnosis present

## 2016-07-13 DIAGNOSIS — I10 Essential (primary) hypertension: Secondary | ICD-10-CM | POA: Diagnosis present

## 2016-07-13 DIAGNOSIS — M1611 Unilateral primary osteoarthritis, right hip: Principal | ICD-10-CM | POA: Diagnosis present

## 2016-07-13 DIAGNOSIS — Z96641 Presence of right artificial hip joint: Secondary | ICD-10-CM

## 2016-07-13 HISTORY — PX: TOTAL HIP ARTHROPLASTY: SHX124

## 2016-07-13 LAB — TYPE AND SCREEN
ABO/RH(D): O POS
ANTIBODY SCREEN: NEGATIVE

## 2016-07-13 LAB — GLUCOSE, CAPILLARY
GLUCOSE-CAPILLARY: 328 mg/dL — AB (ref 65–99)
Glucose-Capillary: 148 mg/dL — ABNORMAL HIGH (ref 65–99)
Glucose-Capillary: 147 mg/dL — ABNORMAL HIGH (ref 65–99)
Glucose-Capillary: 272 mg/dL — ABNORMAL HIGH (ref 65–99)

## 2016-07-13 SURGERY — ARTHROPLASTY, HIP, TOTAL, ANTERIOR APPROACH
Anesthesia: Spinal | Site: Hip | Laterality: Right

## 2016-07-13 MED ORDER — LACTATED RINGERS IV SOLN
INTRAVENOUS | Status: DC
  Administered 2016-07-13: 1000 mL via INTRAVENOUS
  Administered 2016-07-13: 09:00:00 via INTRAVENOUS

## 2016-07-13 MED ORDER — MIDAZOLAM HCL 2 MG/2ML IJ SOLN
INTRAMUSCULAR | Status: AC
  Filled 2016-07-13: qty 2

## 2016-07-13 MED ORDER — HYDROMORPHONE HCL 1 MG/ML IJ SOLN: 0.5000 mg | INTRAMUSCULAR | Status: DC | PRN

## 2016-07-13 MED ORDER — EXENATIDE 10 MCG/0.04ML ~~LOC~~ SOPN: 10.0000 ug | PEN_INJECTOR | Freq: Two times a day (BID) | SUBCUTANEOUS | Status: DC

## 2016-07-13 MED ORDER — MIDAZOLAM HCL 5 MG/5ML IJ SOLN
INTRAMUSCULAR | Status: DC | PRN
  Administered 2016-07-13: 2 mg via INTRAVENOUS

## 2016-07-13 MED ORDER — LINAGLIPTIN 5 MG PO TABS
5.0000 mg | ORAL_TABLET | Freq: Every day | ORAL | Status: DC
  Administered 2016-07-13 – 2016-07-14 (×2): 5 mg via ORAL
  Filled 2016-07-13 (×2): qty 1

## 2016-07-13 MED ORDER — ONDANSETRON HCL 4 MG/2ML IJ SOLN
INTRAMUSCULAR | Status: AC
  Filled 2016-07-13: qty 2

## 2016-07-13 MED ORDER — RIVAROXABAN 10 MG PO TABS: 10.0000 mg | ORAL_TABLET | Freq: Every day | ORAL | 0 refills | Status: DC

## 2016-07-13 MED ORDER — FENTANYL CITRATE (PF) 100 MCG/2ML IJ SOLN
INTRAMUSCULAR | Status: AC
  Filled 2016-07-13: qty 2

## 2016-07-13 MED ORDER — METHOCARBAMOL 500 MG PO TABS: 500.0000 mg | ORAL_TABLET | Freq: Four times a day (QID) | ORAL | 0 refills | Status: DC | PRN

## 2016-07-13 MED ORDER — CLONIDINE HCL 0.1 MG PO TABS
0.3000 mg | ORAL_TABLET | Freq: Every day | ORAL | Status: DC
  Administered 2016-07-14: 10:00:00 0.3 mg via ORAL
  Filled 2016-07-13: qty 3

## 2016-07-13 MED ORDER — LOSARTAN POTASSIUM 50 MG PO TABS
100.0000 mg | ORAL_TABLET | Freq: Every day | ORAL | Status: DC
  Administered 2016-07-13 – 2016-07-14 (×2): 100 mg via ORAL
  Filled 2016-07-13 (×2): qty 2

## 2016-07-13 MED ORDER — SODIUM CHLORIDE 0.9 % IR SOLN
Status: DC | PRN
  Administered 2016-07-13: 1000 mL

## 2016-07-13 MED ORDER — DOCUSATE SODIUM 100 MG PO CAPS: 100.0000 mg | ORAL_CAPSULE | Freq: Two times a day (BID) | ORAL | 0 refills | Status: DC

## 2016-07-13 MED ORDER — HYDROCODONE-ACETAMINOPHEN 7.5-325 MG PO TABS
1.0000 | ORAL_TABLET | ORAL | Status: DC
  Administered 2016-07-13 – 2016-07-14 (×6): 2 via ORAL
  Filled 2016-07-13 (×6): qty 2

## 2016-07-13 MED ORDER — SITAGLIPTIN PHOS-METFORMIN HCL 50-1000 MG PO TABS: 1.0000 | ORAL_TABLET | Freq: Two times a day (BID) | ORAL | Status: DC

## 2016-07-13 MED ORDER — ONDANSETRON HCL 4 MG PO TABS: 4.0000 mg | ORAL_TABLET | Freq: Four times a day (QID) | ORAL | Status: DC | PRN

## 2016-07-13 MED ORDER — ONDANSETRON HCL 4 MG/2ML IJ SOLN: 4.0000 mg | Freq: Once | INTRAMUSCULAR | Status: DC | PRN

## 2016-07-13 MED ORDER — DOCUSATE SODIUM 100 MG PO CAPS
100.0000 mg | ORAL_CAPSULE | Freq: Two times a day (BID) | ORAL | Status: DC
  Administered 2016-07-13 – 2016-07-14 (×2): 100 mg via ORAL
  Filled 2016-07-13 (×2): qty 1

## 2016-07-13 MED ORDER — TRANEXAMIC ACID 1000 MG/10ML IV SOLN
1000.0000 mg | INTRAVENOUS | Status: AC
  Administered 2016-07-13: 1000 mg via INTRAVENOUS
  Filled 2016-07-13: qty 1100

## 2016-07-13 MED ORDER — METHOCARBAMOL 1000 MG/10ML IJ SOLN
500.0000 mg | Freq: Four times a day (QID) | INTRAVENOUS | Status: DC | PRN
  Administered 2016-07-13: 500 mg via INTRAVENOUS
  Filled 2016-07-13: qty 550

## 2016-07-13 MED ORDER — DEXAMETHASONE SODIUM PHOSPHATE 10 MG/ML IJ SOLN
10.0000 mg | Freq: Once | INTRAMUSCULAR | Status: AC
  Administered 2016-07-13: 10 mg via INTRAVENOUS

## 2016-07-13 MED ORDER — MENTHOL 3 MG MT LOZG: 1.0000 | LOZENGE | OROMUCOSAL | Status: DC | PRN

## 2016-07-13 MED ORDER — BISACODYL 10 MG RE SUPP: 10.0000 mg | Freq: Every day | RECTAL | Status: DC | PRN

## 2016-07-13 MED ORDER — METOCLOPRAMIDE HCL 5 MG/ML IJ SOLN: 5.0000 mg | Freq: Three times a day (TID) | INTRAMUSCULAR | Status: DC | PRN

## 2016-07-13 MED ORDER — METOCLOPRAMIDE HCL 5 MG PO TABS: 5.0000 mg | ORAL_TABLET | Freq: Three times a day (TID) | ORAL | Status: DC | PRN

## 2016-07-13 MED ORDER — LOSARTAN POTASSIUM-HCTZ 100-25 MG PO TABS: 1.0000 | ORAL_TABLET | Freq: Every day | ORAL | Status: DC

## 2016-07-13 MED ORDER — MAGNESIUM CITRATE PO SOLN: 1.0000 | Freq: Once | ORAL | Status: DC | PRN

## 2016-07-13 MED ORDER — FENTANYL CITRATE (PF) 100 MCG/2ML IJ SOLN: 25.0000 ug | INTRAMUSCULAR | Status: DC | PRN

## 2016-07-13 MED ORDER — MEPERIDINE HCL 50 MG/ML IJ SOLN: 6.2500 mg | INTRAMUSCULAR | Status: DC | PRN

## 2016-07-13 MED ORDER — SUCCINYLCHOLINE CHLORIDE 200 MG/10ML IV SOSY
PREFILLED_SYRINGE | INTRAVENOUS | Status: AC
  Filled 2016-07-13: qty 10

## 2016-07-13 MED ORDER — ONDANSETRON HCL 4 MG/2ML IJ SOLN: 4.0000 mg | Freq: Four times a day (QID) | INTRAMUSCULAR | Status: DC | PRN

## 2016-07-13 MED ORDER — HYDROMORPHONE HCL 1 MG/ML IJ SOLN: 0.2500 mg | INTRAMUSCULAR | Status: DC | PRN

## 2016-07-13 MED ORDER — STERILE WATER FOR IRRIGATION IR SOLN
Status: DC | PRN
  Administered 2016-07-13: 2000 mL

## 2016-07-13 MED ORDER — FERROUS SULFATE 325 (65 FE) MG PO TABS: 325.0000 mg | ORAL_TABLET | Freq: Three times a day (TID) | ORAL | Status: DC

## 2016-07-13 MED ORDER — DIPHENHYDRAMINE HCL 25 MG PO CAPS: 25.0000 mg | ORAL_CAPSULE | Freq: Four times a day (QID) | ORAL | Status: DC | PRN

## 2016-07-13 MED ORDER — PROPOFOL 500 MG/50ML IV EMUL
INTRAVENOUS | Status: DC | PRN
  Administered 2016-07-13: 100 ug/kg/min via INTRAVENOUS

## 2016-07-13 MED ORDER — LIDOCAINE 2% (20 MG/ML) 5 ML SYRINGE
INTRAMUSCULAR | Status: AC
  Filled 2016-07-13: qty 5

## 2016-07-13 MED ORDER — CEFAZOLIN SODIUM-DEXTROSE 2-4 GM/100ML-% IV SOLN
2.0000 g | Freq: Four times a day (QID) | INTRAVENOUS | Status: AC
  Administered 2016-07-13 (×2): 2 g via INTRAVENOUS
  Filled 2016-07-13 (×2): qty 100

## 2016-07-13 MED ORDER — SODIUM CHLORIDE 0.9 % IV SOLN
INTRAVENOUS | Status: DC
  Administered 2016-07-13: 12:00:00 100 mL/h via INTRAVENOUS
  Administered 2016-07-13: 23:00:00 via INTRAVENOUS

## 2016-07-13 MED ORDER — CHLORHEXIDINE GLUCONATE 4 % EX LIQD: 60.0000 mL | Freq: Once | CUTANEOUS | Status: DC

## 2016-07-13 MED ORDER — INSULIN ASPART 100 UNIT/ML ~~LOC~~ SOLN
0.0000 [IU] | Freq: Three times a day (TID) | SUBCUTANEOUS | Status: DC
  Administered 2016-07-13: 18:00:00 8 [IU] via SUBCUTANEOUS
  Administered 2016-07-14: 08:00:00 5 [IU] via SUBCUTANEOUS

## 2016-07-13 MED ORDER — ALUM & MAG HYDROXIDE-SIMETH 200-200-20 MG/5ML PO SUSP: 15.0000 mL | ORAL | Status: DC | PRN

## 2016-07-13 MED ORDER — ONDANSETRON HCL 4 MG/2ML IJ SOLN
INTRAMUSCULAR | Status: DC | PRN
  Administered 2016-07-13: 4 mg via INTRAVENOUS

## 2016-07-13 MED ORDER — POLYETHYLENE GLYCOL 3350 17 G PO PACK
17.0000 g | PACK | Freq: Two times a day (BID) | ORAL | Status: DC
  Administered 2016-07-13: 17 g via ORAL
  Filled 2016-07-13: qty 1

## 2016-07-13 MED ORDER — PROPOFOL 10 MG/ML IV BOLUS
INTRAVENOUS | Status: AC
  Filled 2016-07-13: qty 60

## 2016-07-13 MED ORDER — DEXAMETHASONE SODIUM PHOSPHATE 10 MG/ML IJ SOLN
INTRAMUSCULAR | Status: AC
  Filled 2016-07-13: qty 1

## 2016-07-13 MED ORDER — HYDROCODONE-ACETAMINOPHEN 7.5-325 MG PO TABS: 1.0000 | ORAL_TABLET | ORAL | 0 refills | Status: DC | PRN

## 2016-07-13 MED ORDER — POLYETHYLENE GLYCOL 3350 17 G PO PACK: 17.0000 g | PACK | Freq: Two times a day (BID) | ORAL | 0 refills | Status: DC

## 2016-07-13 MED ORDER — DOXAZOSIN MESYLATE 8 MG PO TABS
8.0000 mg | ORAL_TABLET | Freq: Every day | ORAL | Status: DC
  Administered 2016-07-14: 10:00:00 8 mg via ORAL
  Filled 2016-07-13: qty 1

## 2016-07-13 MED ORDER — INSULIN GLARGINE 100 UNIT/ML ~~LOC~~ SOLN
50.0000 [IU] | Freq: Every day | SUBCUTANEOUS | Status: DC
  Administered 2016-07-13 – 2016-07-14 (×2): 50 [IU] via SUBCUTANEOUS
  Filled 2016-07-13 (×2): qty 0.5

## 2016-07-13 MED ORDER — RIVAROXABAN 10 MG PO TABS
10.0000 mg | ORAL_TABLET | ORAL | Status: DC
  Administered 2016-07-14: 10 mg via ORAL
  Filled 2016-07-13: qty 1

## 2016-07-13 MED ORDER — HYDROCHLOROTHIAZIDE 25 MG PO TABS
25.0000 mg | ORAL_TABLET | Freq: Every day | ORAL | Status: DC
  Administered 2016-07-13 – 2016-07-14 (×2): 25 mg via ORAL
  Filled 2016-07-13 (×2): qty 1

## 2016-07-13 MED ORDER — METFORMIN HCL 500 MG PO TABS
1000.0000 mg | ORAL_TABLET | Freq: Two times a day (BID) | ORAL | Status: DC
  Administered 2016-07-13 – 2016-07-14 (×2): 1000 mg via ORAL
  Filled 2016-07-13 (×2): qty 2

## 2016-07-13 MED ORDER — CELECOXIB 200 MG PO CAPS
200.0000 mg | ORAL_CAPSULE | Freq: Two times a day (BID) | ORAL | Status: DC
  Administered 2016-07-13 – 2016-07-14 (×2): 200 mg via ORAL
  Filled 2016-07-13 (×2): qty 1

## 2016-07-13 MED ORDER — DEXAMETHASONE SODIUM PHOSPHATE 10 MG/ML IJ SOLN: 10.0000 mg | Freq: Once | INTRAMUSCULAR | Status: DC

## 2016-07-13 MED ORDER — BUPIVACAINE IN DEXTROSE 0.75-8.25 % IT SOLN
INTRATHECAL | Status: DC | PRN
  Administered 2016-07-13: 2 mL via INTRATHECAL

## 2016-07-13 MED ORDER — PHENOL 1.4 % MT LIQD
1.0000 | OROMUCOSAL | Status: DC | PRN
  Filled 2016-07-13: qty 177

## 2016-07-13 MED ORDER — FENTANYL CITRATE (PF) 100 MCG/2ML IJ SOLN
INTRAMUSCULAR | Status: DC | PRN
  Administered 2016-07-13: 100 ug via INTRAVENOUS

## 2016-07-13 MED ORDER — METHOCARBAMOL 500 MG PO TABS
500.0000 mg | ORAL_TABLET | Freq: Four times a day (QID) | ORAL | Status: DC | PRN
  Administered 2016-07-14: 08:00:00 500 mg via ORAL
  Filled 2016-07-13: qty 1

## 2016-07-13 MED ORDER — PROPOFOL 10 MG/ML IV BOLUS
INTRAVENOUS | Status: AC
  Filled 2016-07-13: qty 20

## 2016-07-13 SURGICAL SUPPLY — 36 items
BAG ZIPLOCK 12X15 (MISCELLANEOUS) ×2 IMPLANT
BLADE SAG 18X100X1.27 (BLADE) ×2 IMPLANT
CAPT HIP TOTAL 2 ×2 IMPLANT
CLOTH BEACON ORANGE TIMEOUT ST (SAFETY) ×2 IMPLANT
COVER PERINEAL POST (MISCELLANEOUS) ×2 IMPLANT
COVER SURGICAL LIGHT HANDLE (MISCELLANEOUS) ×2 IMPLANT
DERMABOND ADVANCED (GAUZE/BANDAGES/DRESSINGS) ×1
DERMABOND ADVANCED .7 DNX12 (GAUZE/BANDAGES/DRESSINGS) ×1 IMPLANT
DRAPE STERI IOBAN 125X83 (DRAPES) ×2 IMPLANT
DRAPE U-SHAPE 47X51 STRL (DRAPES) ×4 IMPLANT
DRESSING AQUACEL AG SP 3.5X10 (GAUZE/BANDAGES/DRESSINGS) ×1 IMPLANT
DRSG AQUACEL AG SP 3.5X10 (GAUZE/BANDAGES/DRESSINGS) ×2
DURAPREP 26ML APPLICATOR (WOUND CARE) ×2 IMPLANT
ELECT REM PT RETURN 15FT ADLT (MISCELLANEOUS) ×2 IMPLANT
GLOVE BIOGEL M STRL SZ7.5 (GLOVE) IMPLANT
GLOVE BIOGEL PI IND STRL 7.5 (GLOVE) ×5 IMPLANT
GLOVE BIOGEL PI IND STRL 8.5 (GLOVE) ×1 IMPLANT
GLOVE BIOGEL PI INDICATOR 7.5 (GLOVE) ×5
GLOVE BIOGEL PI INDICATOR 8.5 (GLOVE) ×1
GLOVE ECLIPSE 8.0 STRL XLNG CF (GLOVE) ×4 IMPLANT
GLOVE ORTHO TXT STRL SZ7.5 (GLOVE) ×2 IMPLANT
GLOVE SURG SS PI 7.0 STRL IVOR (GLOVE) ×2 IMPLANT
GLOVE SURG SS PI 7.5 STRL IVOR (GLOVE) ×2 IMPLANT
GOWN SPEC L3 XXLG W/TWL (GOWN DISPOSABLE) ×2 IMPLANT
GOWN STRL REUS W/TWL LRG LVL3 (GOWN DISPOSABLE) ×2 IMPLANT
GOWN STRL REUS W/TWL XL LVL3 (GOWN DISPOSABLE) ×4 IMPLANT
HOLDER FOLEY CATH W/STRAP (MISCELLANEOUS) ×2 IMPLANT
PACK ANTERIOR HIP CUSTOM (KITS) ×2 IMPLANT
SUT MNCRL AB 4-0 PS2 18 (SUTURE) ×2 IMPLANT
SUT STRATAFIX 0 PDS 27 VIOLET (SUTURE) ×2
SUT VIC AB 1 CT1 36 (SUTURE) ×6 IMPLANT
SUT VIC AB 2-0 CT1 27 (SUTURE) ×2
SUT VIC AB 2-0 CT1 TAPERPNT 27 (SUTURE) ×2 IMPLANT
SUTURE STRATFX 0 PDS 27 VIOLET (SUTURE) ×1 IMPLANT
TRAY FOLEY W/METER SILVER 16FR (SET/KITS/TRAYS/PACK) ×2 IMPLANT
YANKAUER SUCT BULB TIP 10FT TU (MISCELLANEOUS) ×2 IMPLANT

## 2016-07-13 NOTE — Transfer of Care (Signed)
Immediate Anesthesia Transfer of Care Note  Patient: [name] [name]  Procedure(s) Performed: Procedure(s): RIGHT TOTAL HIP ARTHROPLASTY ANTERIOR APPROACH (Right)  Patient Location: PACU  Anesthesia Type:Spinal  Level of Consciousness: sedated  Airway & Oxygen Therapy: Patient Spontanous Breathing and Patient connected to face mask oxygen  Post-op Assessment: Report given to RN and Post -op Vital signs reviewed and stable  Post vital signs: Reviewed and stable  Last Vitals:  Vitals:   07/13/16 0540  BP: (!) 153/78  Pulse: 68  Resp: 18  Temp: 37.1 C    Last Pain:  Vitals:   07/13/16 0540  TempSrc: Oral      Patients Stated Pain Goal: 4 (07/13/16 0556)  Complications: No apparent anesthesia complications

## 2016-07-13 NOTE — Interval H&P Note (Signed)
History and Physical Interval Note:  07/13/2016 7:08 AM  [name]  has presented today for surgery, with the diagnosis of Right hip osteoarthritis  The various methods of treatment have been discussed with the patient and family. After consideration of risks, benefits and other options for treatment, the patient has consented to  Procedure(s): RIGHT TOTAL HIP ARTHROPLASTY ANTERIOR APPROACH (Right) as a surgical intervention .  The patient's history has been reviewed, patient examined, no change in status, stable for surgery.  I have reviewed the patient's chart and labs.  Questions were answered to the patient's satisfaction.     Shelda PalLIN,Ashle Stief D

## 2016-07-13 NOTE — Anesthesia Preprocedure Evaluation (Addendum)
Anesthesia Evaluation  Patient identified by MRN, date of birth, ID band Patient awake    Reviewed: Allergy & Precautions, NPO status , Patient's Chart, lab work & pertinent test results  Airway Mallampati: III  TM Distance: >3 FB Neck ROM: Full    Dental no notable dental hx. (+) Teeth Intact   Pulmonary shortness of breath and with exertion, pneumonia, resolved,    Pulmonary exam normal breath sounds clear to auscultation       Cardiovascular hypertension, Pt. on medications Normal cardiovascular exam+ Valvular Problems/Murmurs AI and MR  Rhythm:Regular Rate:Normal     Neuro/Psych  Headaches, negative psych ROS   GI/Hepatic negative GI ROS, Neg liver ROS,   Endo/Other  diabetes, Well Controlled, Type 2, Oral Hypoglycemic Agents, Insulin DependentMorbid obesityHyperlipidemia  Renal/GU negative Renal ROS  negative genitourinary   Musculoskeletal  (+) Arthritis , OA Right hip Hx/o previous back surgery   Abdominal (+) + obese,   Peds  Hematology negative hematology ROS (+)   Anesthesia Other Findings   Reproductive/Obstetrics                           echocardiogram 06/24/2016: Left ventricle: The cavity size was mildly dilated. Wall   thickness was increased in a pattern of mild LVH. There was mild   concentric hypertrophy. Systolic function was mildly reduced. The   estimated ejection fraction was in the range of 45% to 50%. Wall   motion was normal; there were no regional wall motion   abnormalities. Doppler parameters are consistent with abnormal   left ventricular relaxation (grade 1 diastolic dysfunction). - Aortic valve: There was mild regurgitation. Valve area (Vmax):   2.88 cm^2. - Mitral valve: There was mild regurgitation. Valve area by   pressure half-time: 2.42 cm^2. - Left atrium: The atrium was mildly dilated. - Right ventricle: The cavity size was mildly dilated. Wall  thickness was normal. - Right atrium: The atrium was mildly dilated. Anesthesia Physical Anesthesia Plan  ASA: III  Anesthesia Plan: Spinal   Post-op Pain Management:    Induction: Intravenous  Airway Management Planned: Natural Airway  Additional Equipment:   Intra-op Plan:   Post-operative Plan:   Informed Consent: I have reviewed the patients History and Physical, chart, labs and discussed the procedure including the risks, benefits and alternatives for the proposed anesthesia with the patient or authorized representative who has indicated his/her understanding and acceptance.   Dental advisory given  Plan Discussed with:   Anesthesia Plan Comments:         Anesthesia Quick Evaluation

## 2016-07-13 NOTE — Anesthesia Procedure Notes (Addendum)
Spinal  Start time: 07/13/2016 7:20 AM End time: 07/13/2016 7:28 AM Staffing Anesthesiologist: Josephine Igo Resident/CRNA: Danley Danker L Performed: anesthesiologist and resident/CRNA  Preanesthetic Checklist Completed: patient identified, site marked, surgical consent, pre-op evaluation, timeout performed, IV checked, risks and benefits discussed and monitors and equipment checked Spinal Block Patient position: sitting Prep: Betadine Patient monitoring: heart rate, continuous pulse ox and blood pressure Approach: midline Location: L3-4 Injection technique: single-shot Needle Needle type: Pencan  Needle gauge: 24 G Needle length: 10 cm Additional Notes Kit expiration date 01/15/2019 and lot # [id number] Clear CSF free flow, negative heme, negative paresthesia Tolerated well and returned to supine position

## 2016-07-13 NOTE — Anesthesia Postprocedure Evaluation (Signed)
Anesthesia Post Note  Patient: [name] [name]  Procedure(s) Performed: Procedure(s) (LRB): RIGHT TOTAL HIP ARTHROPLASTY ANTERIOR APPROACH (Right)  Patient location during evaluation: PACU Anesthesia Type: Spinal Level of consciousness: awake and alert and oriented Pain management: pain level controlled Vital Signs Assessment: post-procedure vital signs reviewed and stable Respiratory status: spontaneous breathing, nonlabored ventilation and respiratory function stable Cardiovascular status: blood pressure returned to baseline and stable Postop Assessment: no headache, no backache, spinal receding and no signs of nausea or vomiting Anesthetic complications: no       Last Vitals:  Vitals:   07/13/16 1015 07/13/16 1030  BP: 140/76 132/78  Pulse: (!) 49 (!) 49  Resp: 13 17  Temp:  36.7 C    Last Pain:  Vitals:   07/13/16 1030  TempSrc:   PainSc: 0-No pain    LLE Motor Response: Purposeful movement (07/13/16 1030) LLE Sensation: No numbness;No tingling (07/13/16 1030) RLE Motor Response: No movement due to regional block (07/13/16 1030) RLE Sensation: No numbness;No tingling (07/13/16 1030) L Sensory Level: L2-Upper inner thigh, upper buttock (07/13/16 1030) R Sensory Level: L2-Upper inner thigh, upper buttock (07/13/16 1030)  Pari Lombard A.

## 2016-07-13 NOTE — Op Note (Signed)
NAME:  [name] [name]                ACCOUNT NO.: [id number]      MEDICAL RECORD NO.: [id number]      [facility]      PHYSICIAN:  Durene RomansLIN,Tej Murdaugh D  DATE OF BIRTH:  03-03-1950     DATE OF PROCEDURE:  07/13/2016                                 OPERATIVE REPORT         PREOPERATIVE DIAGNOSIS: Right  hip osteoarthritis.      POSTOPERATIVE DIAGNOSIS:  Right hip osteoarthritis.      PROCEDURE:  Right total hip replacement through an anterior approach   utilizing DePuy THR system, component size 56mm pinnacle cup, a size 36+4 neutral   Altrex liner, a size 8 Hi Tri Lock stem with a 36+5 delta ceramic   ball.      SURGEON:  Madlyn FrankelMatthew D. Charlann Boxerlin, M.D.      ASSISTANT:  Lanney GinsMatthew Babish, PA-C     ANESTHESIA:  Spinal.      SPECIMENS:  None.      COMPLICATIONS:  None.      BLOOD LOSS:  250 cc     DRAINS:  None.      INDICATION OF THE PROCEDURE:  [name] [name] is a 66 y.o. male who had   presented to office for evaluation of right hip pain.  Radiographs revealed   progressive degenerative changes with bone-on-bone   articulation to the  hip joint.  The patient had painful limited range of   motion significantly affecting their overall quality of life.  The patient was failing to    respond to conservative measures, and at this point was ready   to proceed with more definitive measures.  The patient has noted progressive   degenerative changes in his hip, progressive problems and dysfunction   with regarding the hip prior to surgery.  Consent was obtained for   benefit of pain relief.  Specific risk of infection, DVT, component   failure, dislocation, need for revision surgery, as well discussion of   the anterior versus posterior approach were reviewed.  Consent was   obtained for benefit of anterior pain relief through an anterior   approach.      PROCEDURE IN DETAIL:  The patient was brought to operative theater.   Once adequate anesthesia, preoperative  antibiotics, 3gm of Ancef, 1 gm of Tranexamic Acid, and 10 mg of Decadron administered.   The patient was positioned supine on the OSI Hanna table.  Once adequate   padding of boney process was carried out, we had predraped out the hip, and  used fluoroscopy to confirm orientation of the pelvis and position.      The right hip was then prepped and draped from proximal iliac crest to   mid thigh with shower curtain technique.      Time-out was performed identifying the patient, planned procedure, and   extremity.     An incision was then made 2 cm distal and lateral to the   anterior superior iliac spine extending over the orientation of the   tensor fascia lata muscle and sharp dissection was carried down to the   fascia of the muscle and protractor placed in the soft tissues.      The fascia was  then incised.  The muscle belly was identified and swept   laterally and retractor placed along the superior neck.  Following   cauterization of the circumflex vessels and removing some pericapsular   fat, a second cobra retractor was placed on the inferior neck.  A third   retractor was placed on the anterior acetabulum after elevating the   anterior rectus.  A L-capsulotomy was along the line of the   superior neck to the trochanteric fossa, then extended proximally and   distally.  Tag sutures were placed and the retractors were then placed   intracapsular.  We then identified the trochanteric fossa and   orientation of my neck cut, confirmed this radiographically   and then made a neck osteotomy with the femur on traction.  The femoral   head was removed without difficulty or complication.  Traction was let   off and retractors were placed posterior and anterior around the   acetabulum.      The labrum and foveal tissue were debrided.  I began reaming with a 46mm   reamer and reamed up to 55mm reamer with good bony bed preparation and a 56mm   cup was chosen.  The final 56mm Pinnacle cup  was then impacted under fluoroscopy  to confirm the depth of penetration and orientation with respect to   abduction.  A screw was placed followed by the hole eliminator.  The final   36+4 neutral Altrex liner was impacted with good visualized rim fit.  The cup was positioned anatomically within the acetabular portion of the pelvis.      At this point, the femur was rolled at 80 degrees.  Further capsule was   released off the inferior aspect of the femoral neck.  I then   released the superior capsule proximally.  The hook was placed laterally   along the femur and elevated manually and held in position with the bed   hook.  The leg was then extended and adducted with the leg rolled to 100   degrees of external rotation.  Once the proximal femur was fully   exposed, I used a box osteotome to set orientation.  I then began   broaching with the starting chili pepper broach and passed this by hand and then broached up to 8.  With the 8 broach in place I chose a high offset neck and did several trial reductions.  The offset was appropriate, leg lengths will be matched when we replace his other hip.   Given these findings, I went ahead and dislocated the hip, repositioned all   retractors and positioned the right hip in the extended and abducted position.  The final 8 Hi Tri Lock stem was   chosen and it was impacted down to the level of neck cut.  Based on this   and the trial reduction, a 36+5 delta ceramic ball was chosen and   impacted onto a clean and dry trunnion, and the hip was reduced.  The   hip had been irrigated throughout the case again at this point.  I did   reapproximate the superior capsular leaflet to the anterior leaflet   using #1 Vicryl.  The fascia of the   tensor fascia lata muscle was then reapproximated using #1 Vicryl and #0 Stratafix sutures.  The   remaining wound was closed with 2-0 Vicryl and running 4-0 Monocryl.   The hip was cleaned, dried, and dressed sterilely  using Dermabond and  Aquacel dressing.  He was then brought   to recovery room in stable condition tolerating the procedure well.    Lanney Gins, PA-C was present for the entirety of the case involved from   preoperative positioning, perioperative retractor management, general   facilitation of the case, as well as primary wound closure as assistant.            Madlyn Frankel Charlann Boxer, M.D.        07/13/2016 9:03 AM

## 2016-07-13 NOTE — Evaluation (Signed)
Physical Therapy Evaluation Patient Details Name: [name] [name] MRN: [id number] DOB: Aug 27, 1950 Today's Date: 07/13/2016   History of Present Illness  s/p R DATHA  Clinical Impression  Pt is s/p THA resulting in the deficits listed below (see PT Problem List).  Pt will benefit from skilled PT to increase their independence and safety with mobility to allow discharge to the venue listed below. Pt should progress well, will continue to follow     Follow Up Recommendations DC plan and follow up therapy as arranged by surgeon    Equipment Recommendations  None recommended by PT (plans to borrow RW)    Recommendations for Other Services       Precautions / Restrictions Precautions Precautions: Fall Restrictions Weight Bearing Restrictions: No Other Position/Activity Restrictions: WBAT      Mobility  Bed Mobility Overal bed mobility: Needs Assistance Bed Mobility: Supine to Sit     Supine to sit: Min assist     General bed mobility comments: cues for self assist, assist with RLE off bed  Transfers Overall transfer level: Needs assistance Equipment used: Rolling walker (2 wheeled)                Ambulation/Gait Ambulation/Gait assistance: Min guard;Supervision Ambulation Distance (Feet): 80 Feet Assistive device: Rolling walker (2 wheeled) Gait Pattern/deviations: Step-to pattern;Step-through pattern     General Gait Details: cues for sequence and RW position  Stairs            Wheelchair Mobility    Modified Rankin (Stroke Patients Only)       Balance             Standing balance-Leahy Scale: Fair                               Pertinent Vitals/Pain Pain Assessment: 0-10 Pain Score: 2  Pain Location: right hip Pain Descriptors / Indicators: Discomfort Pain Intervention(s): Limited activity within patient's tolerance;Monitored during session    Home Living Family/patient expects to be discharged to:: Private  residence Living Arrangements: Spouse/significant other Available Help at Discharge: Family;Available 24 hours/day Type of Home: House Home Access: Stairs to enter Entrance Stairs-Rails: Right Entrance Stairs-Number of Steps: 2 Home Layout: One level;Laundry or work area in [employer]ement Home Equipment: Environmental consultantWalker - standard;Cane - single point Additional Comments: can borrow RW    Prior Function Level of Independence: Independent               Hand Dominance        Extremity/Trunk Assessment   Upper Extremity Assessment Upper Extremity Assessment: Overall WFL for tasks assessed    Lower Extremity Assessment Lower Extremity Assessment: RLE deficits/detail RLE Deficits / Details: ankle WFL; knee/hip  flexion 2+ tp 3/5, limited by post op weakness       Communication   Communication: No difficulties  Cognition Arousal/Alertness: Awake/alert Behavior During Therapy: WFL for tasks assessed/performed Overall Cognitive Status: Within Functional Limits for tasks assessed                                        General Comments      Exercises Total Joint Exercises Ankle Circles/Pumps: AROM;Both;10 reps   Assessment/Plan    PT Assessment Patient needs continued PT services  PT Problem List Decreased strength;Decreased activity tolerance;Decreased mobility;Decreased knowledge of use of DME;Decreased safety awareness;Pain  PT Treatment Interventions DME instruction;Gait training;Functional mobility training;Therapeutic activities;Therapeutic exercise;Patient/family education    PT Goals (Current goals can be found in the Care Plan section)  Acute Rehab PT Goals Patient Stated Goal: back to independence PT Goal Formulation: With patient Time For Goal Achievement: 07/15/16 Potential to Achieve Goals: Good    Frequency 7X/week   Barriers to discharge        Co-evaluation               AM-PAC PT "6 Clicks" Daily Activity  Outcome Measure  Difficulty turning over in bed (including adjusting bedclothes, sheets and blankets)?: A Little Difficulty moving from lying on back to sitting on the side of the bed? : A Little Difficulty sitting down on and standing up from a chair with arms (e.g., wheelchair, bedside commode, etc,.)?: A Little Help needed moving to and from a bed to chair (including a wheelchair)?: A Little Help needed walking in hospital room?: A Little Help needed climbing 3-5 steps with a railing? : A Lot 6 Click Score: 17    End of Session   Activity Tolerance: Patient tolerated treatment well Patient left: with call bell/phone within reach;in chair;with family/visitor present Nurse Communication: Mobility status PT Visit Diagnosis: Difficulty in walking, not elsewhere classified (R26.2);Pain Pain - Right/Left: Right Pain - part of body: Hip    Time: 4098-1191 PT Time Calculation (min) (ACUTE ONLY): 30 min   Charges:   PT Evaluation $PT Eval Low Complexity: 1 Procedure PT Treatments $Gait Training: 8-22 mins   PT G CodesDrucilla Chalet, PT Pager: [phone] 07/13/2016   [facility] 07/13/2016, 2:32 PM

## 2016-07-13 NOTE — Discharge Instructions (Addendum)

## 2016-07-14 ENCOUNTER — Encounter (HOSPITAL_COMMUNITY): Payer: Self-pay | Admitting: Orthopedic Surgery

## 2016-07-14 DIAGNOSIS — E669 Obesity, unspecified: Secondary | ICD-10-CM | POA: Diagnosis present

## 2016-07-14 LAB — CBC
HCT: 37.6 % — ABNORMAL LOW (ref 39.0–52.0)
Hemoglobin: 12.8 g/dL — ABNORMAL LOW (ref 13.0–17.0)
MCH: 28.1 pg (ref 26.0–34.0)
MCHC: 34 g/dL (ref 30.0–36.0)
MCV: 82.5 fL (ref 78.0–100.0)
PLATELETS: 205 10*3/uL (ref 150–400)
RBC: 4.56 MIL/uL (ref 4.22–5.81)
RDW: 13.6 % (ref 11.5–15.5)
WBC: 12.9 10*3/uL — AB (ref 4.0–10.5)

## 2016-07-14 LAB — BASIC METABOLIC PANEL
ANION GAP: 6 (ref 5–15)
BUN: 13 mg/dL (ref 6–20)
CALCIUM: 8.5 mg/dL — AB (ref 8.9–10.3)
CO2: 27 mmol/L (ref 22–32)
Chloride: 103 mmol/L (ref 101–111)
Creatinine, Ser: 0.92 mg/dL (ref 0.61–1.24)
Glucose, Bld: 241 mg/dL — ABNORMAL HIGH (ref 65–99)
Potassium: 4.1 mmol/L (ref 3.5–5.1)
Sodium: 136 mmol/L (ref 135–145)

## 2016-07-14 LAB — GLUCOSE, CAPILLARY
Glucose-Capillary: 213 mg/dL — ABNORMAL HIGH (ref 65–99)
Glucose-Capillary: 266 mg/dL — ABNORMAL HIGH (ref 65–99)

## 2016-07-14 NOTE — Progress Notes (Signed)
     Subjective: 1 Day Post-Op Procedure(s) (LRB): RIGHT TOTAL HIP ARTHROPLASTY ANTERIOR APPROACH (Right)   Patient reports pain as mild, pain controlled.  No events throughout the night.  Discussed the surgery and the potential for eventually proceeding with the left hip.  Ready to be discharged home.   Objective:   VITALS:   Vitals:   07/14/16 0040 07/14/16 0613  BP: 124/70 137/67  Pulse: 62 60  Resp: 14 14  Temp: 97.7 F (36.5 C) 97.4 F (36.3 C)    Dorsiflexion/Plantar flexion intact Incision: dressing C/D/I No cellulitis present Compartment soft  LABS  Recent Labs  07/14/16 0429  HGB 12.8*  HCT 37.6*  WBC 12.9*  PLT 205     Recent Labs  07/14/16 0429  NA 136  K 4.1  BUN 13  CREATININE 0.92  GLUCOSE 241*     Assessment/Plan: 1 Day Post-Op Procedure(s) (LRB): RIGHT TOTAL HIP ARTHROPLASTY ANTERIOR APPROACH (Right) Foley cath d/c'ed Advance diet Up with therapy D/C IV fluids Discharge home [facility] Follow up with OLIN,Saturnino Liew D in 2 weeks.  Contact information:  [facility] [address] [place] [id number] [phone]    Obese (BMI 30-39.9) Estimated body mass index is 39.19 kg/m as calculated from the following:   Height as of this encounter: 5\' 11"  (1.803 m).   Weight as of this encounter: 127.5 kg (281 lb). Patient also counseled that weight may inhibit the healing process Patient counseled that losing weight will help with future health issues      Anastasio AuerbachMatthew S. Taggart Prasad   PAC  07/14/2016, 9:29 AM

## 2016-07-14 NOTE — Progress Notes (Signed)
Discharge planning, no HH needs identified. Has DME. [phone] 

## 2016-07-14 NOTE — Progress Notes (Signed)
Physical Therapy Treatment Patient Details Name: [name] [name] L Alix MRN: [id number] DOB: 01/03/1951 Today's Date: 07/14/2016    History of Present Illness s/p R DATHA    PT Comments    Pt progressing well; wife present for session this morning, all concerns addressed; pt and wife feel ready for D/C   Follow Up Recommendations  DC plan and follow up therapy as arranged by surgeon     Equipment Recommendations  None recommended by PT    Recommendations for Other Services       Precautions / Restrictions Precautions Precautions: Fall Restrictions Weight Bearing Restrictions: No Other Position/Activity Restrictions: WBAT    Mobility  Bed Mobility Overal bed mobility: Needs Assistance Bed Mobility: Sit to Supine     Supine to sit: Supervision Sit to supine: Supervision   General bed mobility comments: for RLE  Transfers Overall transfer level: Needs assistance Equipment used: Rolling walker (2 wheeled) Transfers: Sit to/from Stand Sit to Stand: Supervision         General transfer comment: for safety  Ambulation/Gait Ambulation/Gait assistance: Min guard;Supervision Ambulation Distance (Feet): 220 Feet Assistive device: Rolling walker (2 wheeled) Gait Pattern/deviations: Step-to pattern;Step-through pattern     General Gait Details: cues for sequence, RW position and posture   Stairs Stairs: Yes   Stair Management: One rail Right;One rail Left;Sideways;Step to pattern Number of Stairs: 5 General stair comments: cues for sequence and safety  Wheelchair Mobility    Modified Rankin (Stroke Patients Only)       Balance                                            Cognition Arousal/Alertness: Awake/alert Behavior During Therapy: WFL for tasks assessed/performed Overall Cognitive Status: Within Functional Limits for tasks assessed                                        Exercises Total Joint Exercises Ankle  Circles/Pumps: AROM;Both;10 reps Quad Sets: AROM;Both;10 reps;Strengthening Heel Slides: AROM;Right;10 reps Hip ABduction/ADduction: AAROM;Right;10 reps;Strengthening Long Arc Quad: AROM;Strengthening;Right;10 reps;Seated Knee Flexion: AROM;Strengthening;Right;Standing;10 reps Marching in Standing: AROM;Right;10 reps;Standing;Strengthening Standing Hip Extension: AROM;Strengthening;Right;10 reps;Standing    General Comments        Pertinent Vitals/Pain Pain Assessment: 0-10 Pain Score: 3  Faces Pain Scale: Hurts a little bit Pain Location: right hip Pain Descriptors / Indicators: Guarding;Tightness Pain Intervention(s): Limited activity within patient's tolerance;Monitored during session;Premedicated before session;Ice applied    Home Living Family/patient expects to be discharged to:: Private residence Living Arrangements: Spouse/significant other Available Help at Discharge: Family;Available 24 hours/day           Additional Comments: vanity next to toilet    Prior Function Level of Independence: Independent          PT Goals (current goals can now be found in the care plan section) Acute Rehab PT Goals Patient Stated Goal: back to independence PT Goal Formulation: With patient Time For Goal Achievement: 07/15/16 Potential to Achieve Goals: Good Progress towards PT goals: Progressing toward goals    Frequency    7X/week      PT Plan Current plan remains appropriate    Co-evaluation              AM-PAC PT "6 Clicks" Daily Activity  Outcome Measure  Difficulty turning over in bed (including adjusting bedclothes, sheets and blankets)?: A Little Difficulty moving from lying on back to sitting on the side of the bed? : A Little Difficulty sitting down on and standing up from a chair with arms (e.g., wheelchair, bedside commode, etc,.)?: A Little Help needed moving to and from a bed to chair (including a wheelchair)?: A Little Help needed walking in  hospital room?: A Little Help needed climbing 3-5 steps with a railing? : A Little 6 Click Score: 18    End of Session Equipment Utilized During Treatment: Gait belt Activity Tolerance: Patient tolerated treatment well Patient left: with call bell/phone within reach;in bed;with family/visitor present Nurse Communication: Mobility status PT Visit Diagnosis: Difficulty in walking, not elsewhere classified (R26.2);Pain Pain - Right/Left: Right Pain - part of body: Hip     Time: 1610-9604 PT Time Calculation (min) (ACUTE ONLY): 30 min  Charges:  $Gait Training: 8-22 mins $Therapeutic Exercise: 8-22 mins                    G CodesDrucilla Chalet, PT Pager: [phone] 07/14/2016    Drucilla Chalet 07/14/2016, 10:50 AM

## 2016-07-14 NOTE — Evaluation (Signed)
Occupational Therapy Evaluation Patient Details Name: [name] [name] MRN: [id number] DOB: 11/10/1950 Today's Date: 07/14/2016    History of Present Illness s/p R DATHA   Clinical Impression   This [age] year old man was admitted for the above sx. All education was completed. No further OT is needed at this time    Follow Up Recommendations  No OT follow up;Supervision/Assistance - 24 hour    Equipment Recommendations  None recommended by OT    Recommendations for Other Services       Precautions / Restrictions Precautions Precautions: Fall Restrictions Weight Bearing Restrictions: No      Mobility Bed Mobility   Bed Mobility: Sit to Supine     Supine to sit: Supervision Sit to supine: Min assist   General bed mobility comments: for RLE  Transfers Overall transfer level: Needs assistance Equipment used: Rolling walker (2 wheeled) Transfers: Sit to/from Stand Sit to Stand: Min guard         General transfer comment: for safety    Balance                                           ADL either performed or assessed with clinical judgement   ADL Overall ADL's : Needs assistance/impaired     Grooming: Wash/dry hands;Supervision/safety;Standing   Upper Body Bathing: Set up;Sitting   Lower Body Bathing: Minimal assistance;Sit to/from stand   Upper Body Dressing : Set up;Sitting   Lower Body Dressing: Maximal assistance;Sit to/from stand   Toilet Transfer: Min guard;Ambulation;Comfort height toilet;RW   Toileting- ArchitectClothing Manipulation and Hygiene: Min guard;Sit to/from stand         General ADL Comments: wife will assist with adls.  Demonstrated and explained tub readiness and reviewed working within comfort as well as IT consultantgeneral safety     Vision         Perception     Praxis      Pertinent Vitals/Pain Pain Assessment: Faces Faces Pain Scale: Hurts a little bit Pain Location: right hip Pain Descriptors / Indicators:  Discomfort Pain Intervention(s): Limited activity within patient's tolerance;Monitored during session;Repositioned;Ice applied     Hand Dominance     Extremity/Trunk Assessment Upper Extremity Assessment Upper Extremity Assessment: Overall WFL for tasks assessed           Communication Communication Communication: No difficulties   Cognition Arousal/Alertness: Awake/alert Behavior During Therapy: WFL for tasks assessed/performed Overall Cognitive Status: Within Functional Limits for tasks assessed                                     General Comments       Exercises     Shoulder Instructions      Home Living Family/patient expects to be discharged to:: Private residence Living Arrangements: Spouse/significant other Available Help at Discharge: Family;Available 24 hours/day               Bathroom Shower/Tub: Chief Strategy OfficerTub/shower unit   Bathroom Toilet: Handicapped height         Additional Comments: vanity next to toilet      Prior Functioning/Environment Level of Independence: Independent                 OT Problem List:        OT Treatment/Interventions:  OT Goals(Current goals can be found in the care plan section) Acute Rehab OT Goals Patient Stated Goal: back to independence OT Goal Formulation: All assessment and education complete, DC therapy  OT Frequency:     Barriers to D/C:            Co-evaluation              AM-PAC PT "6 Clicks" Daily Activity     Outcome Measure Help from another person eating meals?: None Help from another person taking care of personal grooming?: A Little Help from another person toileting, which includes using toliet, bedpan, or urinal?: A Little Help from another person bathing (including washing, rinsing, drying)?: A Little Help from another person to put on and taking off regular upper body clothing?: A Little Help from another person to put on and taking off regular lower body  clothing?: A Lot 6 Click Score: 18   End of Session    Activity Tolerance: Patient tolerated treatment well Patient left: in bed;with call bell/phone within reach  OT Visit Diagnosis: Pain Pain - Right/Left: Right Pain - part of body: Hip                Time: 9604-5409 OT Time Calculation (min): 16 min Charges:  OT General Charges $OT Visit: 1 Procedure OT Evaluation $OT Eval Low Complexity: 1 Procedure G-Codes:     [place], OTR/L [phone] 07/14/2016  [name] [name] 07/14/2016, 8:57 AM

## 2016-07-17 NOTE — Discharge Summary (Signed)
Physician Discharge Summary  Patient ID: [name] [name] MRN: [id number] DOB/AGE: March 30, 1950 66 y.o.  Admit date: 07/13/2016 Discharge date: 07/14/2016   Procedures:  Procedure(s) (LRB): RIGHT TOTAL HIP ARTHROPLASTY ANTERIOR APPROACH (Right)  Attending Physician:  Dr. Durene RomansMatthew Olin   Admission Diagnoses:   Right hip primary OA / pain  Discharge Diagnoses:  Principal Problem:   S/P right THA, AA Active Problems:   Obese  Past Medical History:  Diagnosis Date  . Arthritis   . Diabetes mellitus without complication (HCC)    type 2  . Dyspnea    with excertion  . Headache    rare  . Hypertension   . Pneumonia    as a child    HPI:    [name] [name], 66 y.o. male, has a history of pain and functional disability in the right hip(s) due to arthritis and patient has failed non-surgical conservative treatments for greater than 12 weeks to include NSAID's and/or analgesics, corticosteriod injections, supervised PT with diminished ADL's post treatment and activity modification.  Onset of symptoms was gradual starting ~4 years ago with gradually worsening course since that time.The patient noted no past surgery on the right hip(s).  Patient currently rates pain in the right hip at 7 out of 10 with activity. Patient has night pain, worsening of pain with activity and weight bearing, trendelenberg gait, pain that interfers with activities of daily living and pain with passive range of motion. Patient has evidence of periarticular osteophytes and joint space narrowing by imaging studies. This condition presents safety issues increasing the risk of falls.  There is no current active infection.   Risks, benefits and expectations were discussed with the patient.  Risks including but not limited to the risk of anesthesia, blood clots, nerve damage, blood vessel damage, failure of the prosthesis, infection and up to and including death.  Patient understand the risks, benefits and expectations and  wishes to proceed with surgery.   PCP: Alan MulderMorayati, Shamil J, MD   Discharged Condition: good  Hospital Course:  Patient underwent the above stated procedure on 07/13/2016. Patient tolerated the procedure well and brought to the recovery room in good condition and subsequently to the floor.  POD #1 BP: 137/67 ; Pulse: 60 ; Temp: 97.4 F (36.3 C) ; Resp: 14 Patient reports pain as mild, pain controlled.  No events throughout the night.  Discussed the surgery and the potential for eventually proceeding with the left hip.  Ready to be discharged home. Dorsiflexion/plantar flexion intact, incision: dressing C/D/I, no cellulitis present and compartment soft.   LABS  Basename    HGB     12.8  HCT     37.6     Discharge Exam: General appearance: alert, cooperative and no distress Extremities: Homans sign is negative, no sign of DVT, no edema, redness or tenderness in the calves or thighs and no ulcers, gangrene or trophic changes  Disposition: Home with follow up in 2 weeks   Follow-up Information    Durene Romanslin, Renata Gambino, MD. Schedule an appointment as soon as possible for a visit in 2 week(s).   Specialty:  Orthopedic Surgery Contact information: [address] [place] [id number] [phone]           Discharge Instructions    Call MD / Call 911    Complete by:  As directed    If you experience chest pain or shortness of breath, CALL 911 and be transported to the hospital emergency room.  If you develope a fever above 101 F, pus (white drainage) or increased drainage or redness at the wound, or calf pain, call your surgeon's office.   Change dressing    Complete by:  As directed    Maintain surgical dressing until follow up in the clinic. If the edges start to pull up, may reinforce with tape. If the dressing is no longer working, may remove and cover with gauze and tape, but must keep the area dry and clean.  Call with any questions or concerns.   Constipation  Prevention    Complete by:  As directed    Drink plenty of fluids.  Prune juice may be helpful.  You may use a stool softener, such as Colace (over the counter) 100 mg twice a day.  Use MiraLax (over the counter) for constipation as needed.   Diet - low sodium heart healthy    Complete by:  As directed    Discharge instructions    Complete by:  As directed    Maintain surgical dressing until follow up in the clinic. If the edges start to pull up, may reinforce with tape. If the dressing is no longer working, may remove and cover with gauze and tape, but must keep the area dry and clean.  Follow up in 2 weeks at [facility]. Call with any questions or concerns.   Increase activity slowly as tolerated    Complete by:  As directed    Weight bearing as tolerated with assist device (walker, cane, etc) as directed, use it as long as suggested by your surgeon or therapist, typically at least 4-6 weeks.   TED hose    Complete by:  As directed    Use stockings (TED hose) for 2 weeks on both leg(s).  You may remove them at night for sleeping.      Allergies as of 07/14/2016   No Known Allergies     Medication List    STOP taking these medications   acetaminophen 500 MG tablet Commonly known as:  TYLENOL     TAKE these medications   APIDRA 100 UNIT/ML injection Generic drug:  insulin glulisine Inject 20-60 Units into the skin 3 (three) times daily before meals. Based on sliding scale   cloNIDine 0.3 MG tablet Commonly known as:  CATAPRES Take 0.3 mg by mouth daily.   docusate sodium 100 MG capsule Commonly known as:  COLACE Take 1 capsule (100 mg total) by mouth 2 (two) times daily.   doxazosin 8 MG tablet Commonly known as:  CARDURA Take 8 mg by mouth daily.   exenatide 10 MCG/0.04ML Sopn injection Commonly known as:  BYETTA Inject 10 mcg into the skin 2 (two) times daily with a meal.   ferrous sulfate 325 (65 FE) MG tablet Commonly known as:  FERROUSUL Take 1  tablet (325 mg total) by mouth 3 (three) times daily with meals.   HYDROcodone-acetaminophen 7.5-325 MG tablet Commonly known as:  NORCO Take 1-2 tablets by mouth every 4 (four) hours as needed for moderate pain or severe pain.   insulin glargine 100 UNIT/ML injection Commonly known as:  LANTUS Inject 50 Units into the skin daily.   losartan-hydrochlorothiazide 100-25 MG tablet Commonly known as:  HYZAAR Take 1 tablet by mouth daily.   Magnesium Oxide 500 MG Caps Take 500 mg by mouth 2 (two) times daily.   methocarbamol 500 MG tablet Commonly known as:  ROBAXIN Take 1 tablet (500 mg total) by mouth every 6 (  six) hours as needed for muscle spasms.   phentermine 37.5 MG capsule Take 37.5 mg by mouth every morning.   polyethylene glycol packet Commonly known as:  MIRALAX / GLYCOLAX Take 17 g by mouth 2 (two) times daily.   rivaroxaban 10 MG Tabs tablet Commonly known as:  XARELTO Take 1 tablet (10 mg total) by mouth daily.   sitaGLIPtin-metformin 50-1000 MG tablet Commonly known as:  JANUMET Take 1 tablet by mouth 2 (two) times daily with a meal.   Vitamin D (Ergocalciferol) 50000 units Caps capsule Commonly known as:  DRISDOL Take 50,000 Units by mouth every 7 (seven) days.        Signed: Anastasio Auerbach. Kelan Pritt   PA-C  07/17/2016, 8:49 AM

## 2016-11-10 NOTE — H&P (Signed)
TOTAL HIP ADMISSION H&P  Patient is admitted for left total hip arthroplasty, anterior approach .  Subjective:  Chief Complaint: Left hip primary OA / pain  HPI: [name] [name], 66 y.o. male, has a history of pain and functional disability in the left hip(s) due to arthritis and patient has failed non-surgical conservative treatments for greater than 12 weeks to include NSAID's and/or analgesics, corticosteriod injections, supervised PT with diminished ADL's post treatment and activity modification.  Onset of symptoms was gradual starting ~4 years ago with gradually worsening course since that time.The patient noted prior procedures of the hip to include arthroplasty on the right hip per Dr. Charlann Boxer on 07/13/2016.  Patient currently rates pain in the left hip at 7 out of 10 with activity. Patient has night pain, worsening of pain with activity and weight bearing, trendelenberg gait, pain that interfers with activities of daily living and pain with passive range of motion. Patient has evidence of periarticular osteophytes and joint space narrowing by imaging studies. This condition presents safety issues increasing the risk of falls.  There is no current active infection.   Risks, benefits and expectations were discussed with the patient.  Risks including but not limited to the risk of anesthesia, blood clots, nerve damage, blood vessel damage, failure of the prosthesis, infection and up to and including death.  Patient understand the risks, benefits and expectations and wishes to proceed with surgery.   PCP: Alan Mulder, MD  D/C Plans:       Home   Post-op Meds:       No Rx given   Tranexamic Acid:      To be given - IV   Decadron:      Is to be given  FYI:     ASA (ok per pt)  Norco  DME:   Pt already has equipment   PT:   No PT    Patient Active Problem List   Diagnosis Date Noted  . Obese 07/14/2016  . S/P right THA, AA 07/13/2016   Past Medical History:  Diagnosis Date  .  Arthritis   . Diabetes mellitus without complication (HCC)    type 2  . Dyspnea    with excertion  . Headache    rare  . Hypertension   . Pneumonia    as a child    Past Surgical History:  Procedure Laterality Date  . BACK SURGERY     2005  . cartarized     bleeding ulcer  2015  . JOINT REPLACEMENT     Right total hip 5/29 2018  . REPAIR QUADRICEPS / HAMSTRING MUSCLE     torn loose from knee 2009 5/25  . TOTAL HIP ARTHROPLASTY Right 07/13/2016   Procedure: RIGHT TOTAL HIP ARTHROPLASTY ANTERIOR APPROACH;  Surgeon: Durene Romans, MD;  Location: [facility];  Service: Orthopedics;  Laterality: Right;    No prescriptions prior to admission.   No Known Allergies  Social History  Substance Use Topics  . Smoking status: Never Smoker  . Smokeless tobacco: Never Used  . Alcohol use No       Review of Systems  Constitutional: Positive for malaise/fatigue.  HENT: Positive for tinnitus.   Eyes: Negative.   Respiratory: Positive for shortness of breath (with exertion).   Cardiovascular: Negative.   Gastrointestinal: Negative.   Genitourinary: Positive for frequency.  Musculoskeletal: Positive for back pain and joint pain.  Skin: Negative.   Neurological: Negative.   Endo/Heme/Allergies: Negative.  Psychiatric/Behavioral: Negative.     Objective:  Physical Exam  Constitutional: He is oriented to person, place, and time. He appears well-developed.  HENT:  Head: Normocephalic.  Eyes: Pupils are equal, round, and reactive to light.  Neck: Neck supple. No JVD present. No tracheal deviation present. No thyromegaly present.  Cardiovascular: Normal rate, regular rhythm and intact distal pulses.   Respiratory: Effort normal and breath sounds normal. No respiratory distress. He has no wheezes.  GI: Soft. There is no tenderness. There is no guarding.  Musculoskeletal:       Left hip: He exhibits decreased range of motion, decreased strength, tenderness and bony tenderness. He  exhibits no swelling, no deformity and no laceration.  Lymphadenopathy:    He has no cervical adenopathy.  Neurological: He is alert and oriented to person, place, and time.  Skin: Skin is warm and dry.  Psychiatric: He has a normal mood and affect.      Labs:  Estimated body mass index is 39.19 kg/m as calculated from the following:   Height as of 07/13/16:  (1.803 m).   Weight as of 07/13/16: 127.5 kg (281 lb).   Imaging Review Plain radiographs demonstrate severe degenerative joint disease of the left hip(s). The bone quality appears to be good for age and reported activity level.  Assessment/Plan:  End stage arthritis, left hip(s)  The patient history, physical examination, clinical judgement of the provider and imaging studies are consistent with end stage degenerative joint disease of the left hip(s) and total hip arthroplasty is deemed medically necessary. The treatment options including medical management, injection therapy, arthroscopy and arthroplasty were discussed at length. The risks and benefits of total hip arthroplasty were presented and reviewed. The risks due to aseptic loosening, infection, stiffness, dislocation/subluxation,  thromboembolic complications and other imponderables were discussed.  The patient acknowledged the explanation, agreed to proceed with the plan and consent was signed. Patient is being admitted for inpatient treatment for surgery, pain control, PT, OT, prophylactic antibiotics, VTE prophylaxis, progressive ambulation and ADL's and discharge planning.The patient is planning to be discharged home.      [name] Auerbach Kemp Gomes   PA-C  11/10/2016, 10:29 AM

## 2016-11-17 NOTE — Patient Instructions (Addendum)
[name] [name]  11/17/2016   Your procedure is scheduled on: 10-16  Report to [facility] Main  Entrance Report to Admitting at 6:30 AM   Call this number if you have problems the morning of surgery  [phone]   Remember: ONLY 1 PERSON MAY GO WITH YOU TO SHORT STAY TO GET  READY MORNING OF YOUR SURGERY.  Do not eat food or drink liquids :After Midnight.     Take these medicines the morning of surgery with A SIP OF WATER: Doxazosin (Cardura) DO NOT TAKE ANY DIABETIC MEDICATIONS DAY OF YOUR SURGERY                               You may not have any metal on your body including hair pins and              piercings  Do not wear jewelry, lotions, powders, or deodorant             Men may shave face and neck.   Do not bring valuables to the hospital. [facility] IS NOT             RESPONSIBLE   FOR VALUABLES.  Contacts, dentures or bridgework may not be worn into surgery.  Leave suitcase in the car. After surgery it may be brought to your room.                 Please read over the following fact sheets you were given: _____________________________________________________________________  How to Manage Your Diabetes Before and After Surgery  Why is it important to control my blood sugar before and after surgery? . Improving blood sugar levels before and after surgery helps healing and can limit problems. . A way of improving blood sugar control is eating a healthy diet by: o  Eating less sugar and carbohydrates o  Increasing activity/exercise o  Talking with your doctor about reaching your blood sugar goals . High blood sugars (greater than 180 mg/dL) can raise your risk of infections and slow your recovery, so you will need to focus on controlling your diabetes during the weeks before surgery. . Make sure that the doctor who takes care of your diabetes knows about your planned surgery including the date and location.  How do I manage my blood sugar before  surgery? . Check your blood sugar at least 4 times a day, starting 2 days before surgery, to make sure that the level is not too high or low. o Check your blood sugar the morning of your surgery when you wake up and every 2 hours until you get to the Short Stay unit. . If your blood sugar is less than 70 mg/dL, you will need to treat for low blood sugar: o Do not take insulin. o Treat a low blood sugar (less than 70 mg/dL) with  cup of clear juice (cranberry or apple), 4 glucose tablets, OR glucose gel. o Recheck blood sugar in 15 minutes after treatment (to make sure it is greater than 70 mg/dL). If your blood sugar is not greater than 70 mg/dL on recheck, call [phone] for further instructions. . Report your blood sugar to the short stay nurse when you get to Short Stay.  . If you are admitted to the hospital after surgery: o Your blood sugar will be checked by the  staff and you will probably be given insulin after surgery (instead of oral diabetes medicines) to make sure you have good blood sugar levels. o The goal for blood sugar control after surgery is 80-180 mg/dL.   WHAT DO I DO ABOUT MY DIABETES MEDICATION?  Marland Kitchen Do not take oral diabetes medicines (pills) the morning of surgery.  . THE DAY BEFORE SURGERY, take your usual daytime dose of Byetta, Lantus 20 U of your evening dose, Gluisine (Apidra) and Janumet           THE NIGHT OF SURGERY, Do not take your bedtime dose of  Byetta, and Gluisine (Apidra).   . The day of surgery, do not take other diabetes injectables, including Byetta (exenatide), Bydureon (exenatide ER), Victoza (liraglutide), or Trulicity (dulaglutide).   Patient Signature:  Date:   Nurse Signature:  Date:   Reviewed and Endorsed by [facility] Patient Education Committee, August 2015           [facility] - Preparing for Surgery Before surgery, you can play an important role.  Because skin is not sterile, your skin needs to be as free of germs as  possible.  You can reduce the number of germs on your skin by washing with CHG (chlorahexidine gluconate) soap before surgery.  CHG is an antiseptic cleaner which kills germs and bonds with the skin to continue killing germs even after washing. Please DO NOT use if you have an allergy to CHG or antibacterial soaps.  If your skin becomes reddened/irritated stop using the CHG and inform your nurse when you arrive at Short Stay. Do not shave (including legs and underarms) for at least 48 hours prior to the first CHG shower.  You may shave your face/neck. Please follow these instructions carefully:  1.  Shower with CHG Soap the night before surgery and the  morning of Surgery.  2.  If you choose to wash your hair, wash your hair first as usual with your  normal  shampoo.  3.  After you shampoo, rinse your hair and body thoroughly to remove the  shampoo.                           4.  Use CHG as you would any other liquid soap.  You can apply chg directly  to the skin and wash                       Gently with a scrungie or clean washcloth.  5.  Apply the CHG Soap to your body ONLY FROM THE NECK DOWN.   Do not use on face/ open                           Wound or open sores. Avoid contact with eyes, ears mouth and genitals (private parts).                       Wash face,  Genitals (private parts) with your normal soap.             6.  Wash thoroughly, paying special attention to the area where your surgery  will be performed.  7.  Thoroughly rinse your body with warm water from the neck down.  8.  DO NOT shower/wash with your normal soap after using and rinsing off  the CHG Soap.  9.  Pat yourself dry with a clean towel.            10.  Wear clean pajamas.            11.  Place clean sheets on your bed the night of your first shower and do not  sleep with pets. Day of Surgery : Do not apply any lotions/deodorants the morning of surgery.  Please wear clean clothes to the hospital/surgery  center.  FAILURE TO FOLLOW THESE INSTRUCTIONS MAY RESULT IN THE CANCELLATION OF YOUR SURGERY PATIENT SIGNATURE_________________________________  NURSE SIGNATURE__________________________________  ________________________________________________________________________   Adam Phenix  An incentive spirometer is a tool that can help keep your lungs clear and active. This tool measures how well you are filling your lungs with each breath. Taking long deep breaths may help reverse or decrease the chance of developing breathing (pulmonary) problems (especially infection) following:  A long period of time when you are unable to move or be active. BEFORE THE PROCEDURE   If the spirometer includes an indicator to show your best effort, your nurse or respiratory therapist will set it to a desired goal.  If possible, sit up straight or lean slightly forward. Try not to slouch.  Hold the incentive spirometer in an upright position. INSTRUCTIONS FOR USE  1. Sit on the edge of your bed if possible, or sit up as far as you can in bed or on a chair. 2. Hold the incentive spirometer in an upright position. 3. Breathe out normally. 4. Place the mouthpiece in your mouth and seal your lips tightly around it. 5. Breathe in slowly and as deeply as possible, raising the piston or the ball toward the top of the column. 6. Hold your breath for 3-5 seconds or for as long as possible. Allow the piston or ball to fall to the bottom of the column. 7. Remove the mouthpiece from your mouth and breathe out normally. 8. Rest for a few seconds and repeat Steps 1 through 7 at least 10 times every 1-2 hours when you are awake. Take your time and take a few normal breaths between deep breaths. 9. The spirometer may include an indicator to show your best effort. Use the indicator as a goal to work toward during each repetition. 10. After each set of 10 deep breaths, practice coughing to be sure your lungs are  clear. If you have an incision (the cut made at the time of surgery), support your incision when coughing by placing a pillow or rolled up towels firmly against it. Once you are able to get out of bed, walk around indoors and cough well. You may stop using the incentive spirometer when instructed by your caregiver.  RISKS AND COMPLICATIONS  Take your time so you do not get dizzy or light-headed.  If you are in pain, you may need to take or ask for pain medication before doing incentive spirometry. It is harder to take a deep breath if you are having pain. AFTER USE  Rest and breathe slowly and easily.  It can be helpful to keep track of a log of your progress. Your caregiver can provide you with a simple table to help with this. If you are using the spirometer at home, follow these instructions: [place] IF:   You are having difficultly using the spirometer.  You have trouble using the spirometer as often as instructed.  Your pain medication is not giving enough relief while using the spirometer.  You  develop fever of 100.5 F (38.1 C) or higher. SEEK IMMEDIATE MEDICAL CARE IF:   You cough up bloody sputum that had not been present before.  You develop fever of 102 F (38.9 C) or greater.  You develop worsening pain at or near the incision site. MAKE SURE YOU:   Understand these instructions.  Will watch your condition.  Will get help right away if you are not doing well or get worse. Document Released: 06/14/2006 Document Revised: 04/26/2011 Document Reviewed: 08/15/2006 ExitCare Patient Information 2014 Exit[employer], [place].   ________________________________________________________________________  WHAT IS A BLOOD TRANSFUSION? Blood Transfusion Information  A transfusion is the replacement of blood or some of its parts. Blood is made up of multiple cells which provide different functions.  Red blood cells carry oxygen and are used for blood loss  replacement.  White blood cells fight against infection.  Platelets control bleeding.  Plasma helps clot blood.  Other blood products are available for specialized needs, such as hemophilia or other clotting disorders. BEFORE THE TRANSFUSION  Who gives blood for transfusions?   Healthy volunteers who are fully evaluated to make sure their blood is safe. This is blood bank blood. Transfusion therapy is the safest it has ever been in the practice of medicine. Before blood is taken from a donor, a complete history is taken to make sure that person has no history of diseases nor engages in risky social behavior (examples are intravenous drug use or sexual activity with multiple partners). The donor's travel history is screened to minimize risk of transmitting infections, such as malaria. The donated blood is tested for signs of infectious diseases, such as HIV and hepatitis. The blood is then tested to be sure it is compatible with you in order to minimize the chance of a transfusion reaction. If you or a relative donates blood, this is often done in anticipation of surgery and is not appropriate for emergency situations. It takes many days to process the donated blood. RISKS AND COMPLICATIONS Although transfusion therapy is very safe and saves many lives, the main dangers of transfusion include:   Getting an infectious disease.  Developing a transfusion reaction. This is an allergic reaction to something in the blood you were given. Every precaution is taken to prevent this. The decision to have a blood transfusion has been considered carefully by your caregiver before blood is given. Blood is not given unless the benefits outweigh the risks. AFTER THE TRANSFUSION  Right after receiving a blood transfusion, you will usually feel much better and more energetic. This is especially true if your red blood cells have gotten low (anemic). The transfusion raises the level of the red blood cells which  carry oxygen, and this usually causes an energy increase.  The nurse administering the transfusion will monitor you carefully for complications. HOME CARE INSTRUCTIONS  No special instructions are needed after a transfusion. You may find your energy is better. Speak with your caregiver about any limitations on activity for underlying diseases you may have. SEEK MEDICAL CARE IF:   Your condition is not improving after your transfusion.  You develop redness or irritation at the intravenous (IV) site. SEEK IMMEDIATE MEDICAL CARE IF:  Any of the following symptoms occur over the next 12 hours:  Shaking chills.  You have a temperature by mouth above 102 F (38.9 C), not controlled by medicine.  Chest, back, or muscle pain.  People around you feel you are not acting correctly or are confused.  Shortness of  breath or difficulty breathing.  Dizziness and fainting.  You get a rash or develop hives.  You have a decrease in urine output.  Your urine turns a dark color or changes to pink, red, or brown. Any of the following symptoms occur over the next 10 days:  You have a temperature by mouth above 102 F (38.9 C), not controlled by medicine.  Shortness of breath.  Weakness after normal activity.  The white part of the eye turns yellow (jaundice).  You have a decrease in the amount of urine or are urinating less often.  Your urine turns a dark color or changes to pink, red, or brown. Document Released: 01/30/2000 Document Revised: 04/26/2011 Document Reviewed: 09/18/2007 [facility] Patient Information 2014 [place], [place].  _______________________________________________________________________

## 2016-11-23 ENCOUNTER — Encounter (HOSPITAL_COMMUNITY): Payer: Self-pay

## 2016-11-23 ENCOUNTER — Encounter (HOSPITAL_COMMUNITY)
Admission: RE | Admit: 2016-11-23 | Discharge: 2016-11-23 | Disposition: A | Discharge status: Home / Self Care | Payer: Managed Care, Other (non HMO) | Source: Ambulatory Visit | Attending: Orthopedic Surgery | Admitting: Orthopedic Surgery

## 2016-11-23 DIAGNOSIS — M1612 Unilateral primary osteoarthritis, left hip: Secondary | ICD-10-CM | POA: Insufficient documentation

## 2016-11-23 DIAGNOSIS — M25552 Pain in left hip: Secondary | ICD-10-CM | POA: Insufficient documentation

## 2016-11-23 DIAGNOSIS — R0609 Other forms of dyspnea: Secondary | ICD-10-CM | POA: Insufficient documentation

## 2016-11-23 DIAGNOSIS — E119 Type 2 diabetes mellitus without complications: Secondary | ICD-10-CM | POA: Insufficient documentation

## 2016-11-23 DIAGNOSIS — Z01812 Encounter for preprocedural laboratory examination: Secondary | ICD-10-CM | POA: Diagnosis not present

## 2016-11-23 DIAGNOSIS — I10 Essential (primary) hypertension: Secondary | ICD-10-CM | POA: Diagnosis not present

## 2016-11-23 LAB — BASIC METABOLIC PANEL
Anion gap: 7 (ref 5–15)
BUN: 16 mg/dL (ref 6–20)
CHLORIDE: 104 mmol/L (ref 101–111)
CO2: 28 mmol/L (ref 22–32)
Calcium: 9.2 mg/dL (ref 8.9–10.3)
Creatinine, Ser: 0.88 mg/dL (ref 0.61–1.24)
GFR calc Af Amer: >60 mL/min (ref >60)
GFR calc non Af Amer: >60 mL/min (ref >60)
Glucose, Bld: 107 mg/dL — ABNORMAL HIGH (ref 65–99)
POTASSIUM: 4 mmol/L (ref 3.5–5.1)
SODIUM: 139 mmol/L (ref 135–145)

## 2016-11-23 LAB — CBC
HCT: 41.9 % (ref 39.0–52.0)
Hemoglobin: 13.7 g/dL (ref 13.0–17.0)
MCH: 27 pg (ref 26.0–34.0)
MCHC: 32.7 g/dL (ref 30.0–36.0)
MCV: 82.6 fL (ref 78.0–100.0)
Platelets: 219 10*3/uL (ref 150–400)
RBC: 5.07 MIL/uL (ref 4.22–5.81)
RDW: 13.5 % (ref 11.5–15.5)
WBC: 7.8 10*3/uL (ref 4.0–10.5)

## 2016-11-23 LAB — HEMOGLOBIN A1C
Hgb A1c MFr Bld: 6.5 % — ABNORMAL HIGH (ref 4.8–5.6)
Mean Plasma Glucose: 139.85 mg/dL

## 2016-11-23 LAB — GLUCOSE, CAPILLARY: GLUCOSE-CAPILLARY: 111 mg/dL — AB (ref 65–99)

## 2016-11-23 LAB — SURGICAL PCR SCREEN
MRSA, PCR: NEGATIVE
Staphylococcus aureus: NEGATIVE

## 2016-11-23 NOTE — Progress Notes (Addendum)
Surgical Clearance from Dr. Alger Memos on chart.  06-22-16 EKG from Dr. Alger Memos on chart  06-24-16(EPIC) ECHO

## 2016-11-29 MED ORDER — DEXTROSE 5 % IV SOLN
3.0000 g | INTRAVENOUS | Status: AC
  Administered 2016-11-30: 3 g via INTRAVENOUS
  Filled 2016-11-29: qty 3

## 2016-11-29 NOTE — Anesthesia Preprocedure Evaluation (Signed)
Anesthesia Evaluation  Patient identified by MRN, date of birth, ID band Patient awake    Reviewed: Allergy & Precautions, NPO status , Patient's Chart, lab work & pertinent test results  Airway Mallampati: III  TM Distance: >3 FB Neck ROM: Full    Dental no notable dental hx. (+) Teeth Intact   Pulmonary shortness of breath and with exertion, pneumonia, resolved,    Pulmonary exam normal breath sounds clear to auscultation       Cardiovascular hypertension, Pt. on medications Normal cardiovascular exam+ Valvular Problems/Murmurs AI and MR  Rhythm:Regular Rate:Normal     Neuro/Psych  Headaches, negative psych ROS   GI/Hepatic negative GI ROS, Neg liver ROS,   Endo/Other  diabetes, Well Controlled, Type 2, Oral Hypoglycemic Agents, Insulin DependentMorbid obesityHyperlipidemia  Renal/GU negative Renal ROS  negative genitourinary   Musculoskeletal  (+) Arthritis , OA Right hip Hx/o previous back surgery   Abdominal (+) + obese,   Peds  Hematology negative hematology ROS (+)   Anesthesia Other Findings   Reproductive/Obstetrics                            echocardiogram 06/24/2016: Left ventricle: The cavity size was mildly dilated. Wall   thickness was increased in a pattern of mild LVH. There was mild   concentric hypertrophy. Systolic function was mildly reduced. The   estimated ejection fraction was in the range of 45% to 50%. Wall   motion was normal; there were no regional wall motion   abnormalities. Doppler parameters are consistent with abnormal   left ventricular relaxation (grade 1 diastolic dysfunction). - Aortic valve: There was mild regurgitation. Valve area (Vmax):   2.88 cm^2. - Mitral valve: There was mild regurgitation. Valve area by   pressure half-time: 2.42 cm^2. - Left atrium: The atrium was mildly dilated. - Right ventricle: The cavity size was mildly dilated. Wall  thickness was normal. - Right atrium: The atrium was mildly dilated. Anesthesia Physical  Anesthesia Plan  ASA: III  Anesthesia Plan: Spinal   Post-op Pain Management:    Induction: Intravenous  PONV Risk Score and Plan:   Airway Management Planned: Natural Airway  Additional Equipment:   Intra-op Plan:   Post-operative Plan:   Informed Consent: I have reviewed the patients History and Physical, chart, labs and discussed the procedure including the risks, benefits and alternatives for the proposed anesthesia with the patient or authorized representative who has indicated his/her understanding and acceptance.   Dental advisory given  Plan Discussed with:   Anesthesia Plan Comments:         Anesthesia Quick Evaluation

## 2016-11-30 ENCOUNTER — Encounter (HOSPITAL_COMMUNITY): Payer: Self-pay

## 2016-11-30 ENCOUNTER — Inpatient Hospital Stay (HOSPITAL_COMMUNITY): Payer: Managed Care, Other (non HMO)

## 2016-11-30 ENCOUNTER — Encounter (HOSPITAL_COMMUNITY)
Admission: RE | Disposition: A | Discharge status: Home / Self Care | Payer: Self-pay | Source: Ambulatory Visit | Attending: Orthopedic Surgery

## 2016-11-30 ENCOUNTER — Inpatient Hospital Stay (HOSPITAL_COMMUNITY)
Admission: RE | Admit: 2016-11-30 | Discharge: 2016-12-01 | DRG: 470 | Disposition: A | Discharge status: Home / Self Care | Payer: Managed Care, Other (non HMO) | Source: Ambulatory Visit | Attending: Orthopedic Surgery | Admitting: Orthopedic Surgery

## 2016-11-30 ENCOUNTER — Inpatient Hospital Stay (HOSPITAL_COMMUNITY): Payer: Managed Care, Other (non HMO) | Admitting: Anesthesiology

## 2016-11-30 DIAGNOSIS — E119 Type 2 diabetes mellitus without complications: Secondary | ICD-10-CM | POA: Diagnosis present

## 2016-11-30 DIAGNOSIS — Z96641 Presence of right artificial hip joint: Secondary | ICD-10-CM | POA: Diagnosis present

## 2016-11-30 DIAGNOSIS — H9319 Tinnitus, unspecified ear: Secondary | ICD-10-CM | POA: Diagnosis present

## 2016-11-30 DIAGNOSIS — M1612 Unilateral primary osteoarthritis, left hip: Principal | ICD-10-CM | POA: Diagnosis present

## 2016-11-30 DIAGNOSIS — I10 Essential (primary) hypertension: Secondary | ICD-10-CM | POA: Diagnosis present

## 2016-11-30 DIAGNOSIS — Z96649 Presence of unspecified artificial hip joint: Secondary | ICD-10-CM

## 2016-11-30 DIAGNOSIS — Z7984 Long term (current) use of oral hypoglycemic drugs: Secondary | ICD-10-CM | POA: Diagnosis not present

## 2016-11-30 DIAGNOSIS — E669 Obesity, unspecified: Secondary | ICD-10-CM | POA: Diagnosis present

## 2016-11-30 HISTORY — PX: TOTAL HIP ARTHROPLASTY: SHX124

## 2016-11-30 LAB — GLUCOSE, CAPILLARY
GLUCOSE-CAPILLARY: 127 mg/dL — AB (ref 65–99)
GLUCOSE-CAPILLARY: 132 mg/dL — AB (ref 65–99)
GLUCOSE-CAPILLARY: 214 mg/dL — AB (ref 65–99)
Glucose-Capillary: 296 mg/dL — ABNORMAL HIGH (ref 65–99)

## 2016-11-30 LAB — TYPE AND SCREEN
ABO/RH(D): O POS
Antibody Screen: NEGATIVE

## 2016-11-30 SURGERY — ARTHROPLASTY, HIP, TOTAL, ANTERIOR APPROACH
Anesthesia: Spinal | Site: Hip | Laterality: Left

## 2016-11-30 MED ORDER — ASPIRIN 81 MG PO CHEW: 81.0000 mg | CHEWABLE_TABLET | Freq: Two times a day (BID) | ORAL | 0 refills | Status: AC

## 2016-11-30 MED ORDER — DOXAZOSIN MESYLATE 8 MG PO TABS
8.0000 mg | ORAL_TABLET | Freq: Every day | ORAL | Status: DC
  Filled 2016-11-30: qty 1

## 2016-11-30 MED ORDER — FENTANYL CITRATE (PF) 100 MCG/2ML IJ SOLN
INTRAMUSCULAR | Status: DC | PRN
  Administered 2016-11-30: 100 ug via INTRAVENOUS

## 2016-11-30 MED ORDER — ONDANSETRON HCL 4 MG/2ML IJ SOLN
INTRAMUSCULAR | Status: DC | PRN
  Administered 2016-11-30: 4 mg via INTRAVENOUS

## 2016-11-30 MED ORDER — PROPOFOL 10 MG/ML IV BOLUS
INTRAVENOUS | Status: AC
  Filled 2016-11-30: qty 40

## 2016-11-30 MED ORDER — CELECOXIB 200 MG PO CAPS
200.0000 mg | ORAL_CAPSULE | Freq: Two times a day (BID) | ORAL | Status: DC
  Administered 2016-11-30 – 2016-12-01 (×2): 200 mg via ORAL
  Filled 2016-11-30 (×2): qty 1

## 2016-11-30 MED ORDER — ONDANSETRON HCL 4 MG PO TABS: 4.0000 mg | ORAL_TABLET | Freq: Four times a day (QID) | ORAL | Status: DC | PRN

## 2016-11-30 MED ORDER — TRANEXAMIC ACID 1000 MG/10ML IV SOLN
1000.0000 mg | Freq: Once | INTRAVENOUS | Status: AC
  Administered 2016-11-30: 1000 mg via INTRAVENOUS
  Filled 2016-11-30: qty 1100

## 2016-11-30 MED ORDER — METHOCARBAMOL 500 MG PO TABS: 500.0000 mg | ORAL_TABLET | Freq: Four times a day (QID) | ORAL | 0 refills | Status: DC | PRN

## 2016-11-30 MED ORDER — TRANEXAMIC ACID 1000 MG/10ML IV SOLN
1000.0000 mg | INTRAVENOUS | Status: AC
  Administered 2016-11-30: 1000 mg via INTRAVENOUS
  Filled 2016-11-30: qty 1100

## 2016-11-30 MED ORDER — CLONIDINE HCL 0.1 MG PO TABS
0.3000 mg | ORAL_TABLET | Freq: Every day | ORAL | Status: DC
  Administered 2016-11-30: 0.3 mg via ORAL
  Filled 2016-11-30 (×2): qty 3

## 2016-11-30 MED ORDER — ONDANSETRON HCL 4 MG/2ML IJ SOLN
INTRAMUSCULAR | Status: AC
  Filled 2016-11-30: qty 2

## 2016-11-30 MED ORDER — FENTANYL CITRATE (PF) 100 MCG/2ML IJ SOLN
INTRAMUSCULAR | Status: AC
  Filled 2016-11-30: qty 2

## 2016-11-30 MED ORDER — STERILE WATER FOR IRRIGATION IR SOLN
Status: DC | PRN
  Administered 2016-11-30: 2000 mL

## 2016-11-30 MED ORDER — MENTHOL 3 MG MT LOZG: 1.0000 | LOZENGE | OROMUCOSAL | Status: DC | PRN

## 2016-11-30 MED ORDER — INSULIN GLARGINE 100 UNIT/ML ~~LOC~~ SOLN
40.0000 [IU] | Freq: Every evening | SUBCUTANEOUS | Status: DC
  Administered 2016-11-30: 40 [IU] via SUBCUTANEOUS
  Filled 2016-11-30: qty 0.4

## 2016-11-30 MED ORDER — FENTANYL CITRATE (PF) 100 MCG/2ML IJ SOLN: 25.0000 ug | INTRAMUSCULAR | Status: DC | PRN

## 2016-11-30 MED ORDER — SITAGLIPTIN PHOS-METFORMIN HCL 50-1000 MG PO TABS: 1.0000 | ORAL_TABLET | Freq: Two times a day (BID) | ORAL | Status: DC

## 2016-11-30 MED ORDER — CHLORHEXIDINE GLUCONATE 4 % EX LIQD: 60.0000 mL | Freq: Once | CUTANEOUS | Status: DC

## 2016-11-30 MED ORDER — METHOCARBAMOL 500 MG PO TABS: 500.0000 mg | ORAL_TABLET | Freq: Four times a day (QID) | ORAL | Status: DC | PRN

## 2016-11-30 MED ORDER — MIDAZOLAM HCL 5 MG/5ML IJ SOLN
INTRAMUSCULAR | Status: DC | PRN
  Administered 2016-11-30: 2 mg via INTRAVENOUS

## 2016-11-30 MED ORDER — METFORMIN HCL 500 MG PO TABS
1000.0000 mg | ORAL_TABLET | Freq: Two times a day (BID) | ORAL | Status: DC
  Administered 2016-11-30 – 2016-12-01 (×2): 1000 mg via ORAL
  Filled 2016-11-30 (×2): qty 2

## 2016-11-30 MED ORDER — DEXAMETHASONE SODIUM PHOSPHATE 10 MG/ML IJ SOLN: 10.0000 mg | Freq: Once | INTRAMUSCULAR | Status: DC

## 2016-11-30 MED ORDER — PHENOL 1.4 % MT LIQD: 1.0000 | OROMUCOSAL | Status: DC | PRN

## 2016-11-30 MED ORDER — FAMOTIDINE 20 MG PO TABS: 20.0000 mg | ORAL_TABLET | Freq: Every day | ORAL | Status: DC | PRN

## 2016-11-30 MED ORDER — PROPOFOL 10 MG/ML IV BOLUS
INTRAVENOUS | Status: DC | PRN
  Administered 2016-11-30 (×2): 10 mg via INTRAVENOUS

## 2016-11-30 MED ORDER — ONDANSETRON HCL 4 MG/2ML IJ SOLN: 4.0000 mg | Freq: Once | INTRAMUSCULAR | Status: DC | PRN

## 2016-11-30 MED ORDER — DOCUSATE SODIUM 100 MG PO CAPS
100.0000 mg | ORAL_CAPSULE | Freq: Two times a day (BID) | ORAL | Status: DC
  Administered 2016-11-30 – 2016-12-01 (×2): 100 mg via ORAL
  Filled 2016-11-30 (×2): qty 1

## 2016-11-30 MED ORDER — EXENATIDE 10 MCG/0.04ML ~~LOC~~ SOPN: 10.0000 ug | PEN_INJECTOR | Freq: Two times a day (BID) | SUBCUTANEOUS | Status: DC

## 2016-11-30 MED ORDER — METOCLOPRAMIDE HCL 5 MG/ML IJ SOLN: 5.0000 mg | Freq: Three times a day (TID) | INTRAMUSCULAR | Status: DC | PRN

## 2016-11-30 MED ORDER — DEXAMETHASONE SODIUM PHOSPHATE 10 MG/ML IJ SOLN
INTRAMUSCULAR | Status: AC
  Filled 2016-11-30: qty 1

## 2016-11-30 MED ORDER — ALUM & MAG HYDROXIDE-SIMETH 200-200-20 MG/5ML PO SUSP: 15.0000 mL | ORAL | Status: DC | PRN

## 2016-11-30 MED ORDER — ASPIRIN 81 MG PO CHEW
81.0000 mg | CHEWABLE_TABLET | Freq: Two times a day (BID) | ORAL | Status: DC
  Administered 2016-11-30 – 2016-12-01 (×2): 81 mg via ORAL
  Filled 2016-11-30 (×2): qty 1

## 2016-11-30 MED ORDER — ONDANSETRON HCL 4 MG/2ML IJ SOLN: 4.0000 mg | Freq: Four times a day (QID) | INTRAMUSCULAR | Status: DC | PRN

## 2016-11-30 MED ORDER — HYDROCODONE-ACETAMINOPHEN 7.5-325 MG PO TABS: 1.0000 | ORAL_TABLET | ORAL | 0 refills | Status: DC | PRN

## 2016-11-30 MED ORDER — MIDAZOLAM HCL 2 MG/2ML IJ SOLN
INTRAMUSCULAR | Status: AC
  Filled 2016-11-30: qty 2

## 2016-11-30 MED ORDER — DOCUSATE SODIUM 100 MG PO CAPS: 100.0000 mg | ORAL_CAPSULE | Freq: Two times a day (BID) | ORAL | 0 refills | Status: DC

## 2016-11-30 MED ORDER — HYDROCODONE-ACETAMINOPHEN 7.5-325 MG PO TABS
1.0000 | ORAL_TABLET | ORAL | Status: DC
  Administered 2016-11-30 – 2016-12-01 (×6): 2 via ORAL
  Filled 2016-11-30 (×6): qty 2

## 2016-11-30 MED ORDER — METOCLOPRAMIDE HCL 5 MG PO TABS: 5.0000 mg | ORAL_TABLET | Freq: Three times a day (TID) | ORAL | Status: DC | PRN

## 2016-11-30 MED ORDER — LACTATED RINGERS IV SOLN
INTRAVENOUS | Status: DC
  Administered 2016-11-30: 10:00:00 via INTRAVENOUS
  Administered 2016-11-30: 1000 mL via INTRAVENOUS

## 2016-11-30 MED ORDER — LOSARTAN POTASSIUM-HCTZ 100-25 MG PO TABS: 1.0000 | ORAL_TABLET | Freq: Every day | ORAL | Status: DC

## 2016-11-30 MED ORDER — POLYETHYLENE GLYCOL 3350 17 G PO PACK
17.0000 g | PACK | Freq: Two times a day (BID) | ORAL | Status: DC
  Administered 2016-11-30: 17 g via ORAL
  Filled 2016-11-30: qty 1

## 2016-11-30 MED ORDER — FERROUS SULFATE 325 (65 FE) MG PO TABS
325.0000 mg | ORAL_TABLET | Freq: Three times a day (TID) | ORAL | Status: DC
  Administered 2016-11-30 – 2016-12-01 (×2): 325 mg via ORAL
  Filled 2016-11-30 (×2): qty 1

## 2016-11-30 MED ORDER — PROPOFOL 10 MG/ML IV BOLUS
INTRAVENOUS | Status: AC
  Filled 2016-11-30: qty 20

## 2016-11-30 MED ORDER — MAGNESIUM CITRATE PO SOLN: 1.0000 | Freq: Once | ORAL | Status: DC | PRN

## 2016-11-30 MED ORDER — SODIUM CHLORIDE 0.9 % IV SOLN
INTRAVENOUS | Status: DC
  Administered 2016-11-30: 13:00:00 via INTRAVENOUS

## 2016-11-30 MED ORDER — MEPERIDINE HCL 50 MG/ML IJ SOLN: 6.2500 mg | INTRAMUSCULAR | Status: DC | PRN

## 2016-11-30 MED ORDER — CEFAZOLIN SODIUM-DEXTROSE 2-4 GM/100ML-% IV SOLN
2.0000 g | Freq: Four times a day (QID) | INTRAVENOUS | Status: AC
  Administered 2016-11-30 (×2): 2 g via INTRAVENOUS
  Filled 2016-11-30 (×2): qty 100

## 2016-11-30 MED ORDER — SODIUM CHLORIDE 0.9 % IR SOLN
Status: DC | PRN
Start: 2016-11-30 — End: 2016-11-30
  Administered 2016-11-30: 1000 mL

## 2016-11-30 MED ORDER — LINAGLIPTIN 5 MG PO TABS
5.0000 mg | ORAL_TABLET | Freq: Every day | ORAL | Status: DC
  Administered 2016-12-01: 07:00:00 5 mg via ORAL
  Filled 2016-11-30: qty 1

## 2016-11-30 MED ORDER — HYDROCHLOROTHIAZIDE 25 MG PO TABS
25.0000 mg | ORAL_TABLET | Freq: Every day | ORAL | Status: DC
  Administered 2016-11-30: 13:00:00 25 mg via ORAL
  Filled 2016-11-30 (×2): qty 1

## 2016-11-30 MED ORDER — POLYETHYLENE GLYCOL 3350 17 G PO PACK: 17.0000 g | PACK | Freq: Two times a day (BID) | ORAL | 0 refills | Status: DC

## 2016-11-30 MED ORDER — FERROUS SULFATE 325 (65 FE) MG PO TABS: 325.0000 mg | ORAL_TABLET | Freq: Three times a day (TID) | ORAL | 3 refills | Status: DC

## 2016-11-30 MED ORDER — BUPIVACAINE IN DEXTROSE 0.75-8.25 % IT SOLN
INTRATHECAL | Status: DC | PRN
  Administered 2016-11-30: 2 mL via INTRATHECAL

## 2016-11-30 MED ORDER — METHOCARBAMOL 1000 MG/10ML IJ SOLN
500.0000 mg | Freq: Four times a day (QID) | INTRAVENOUS | Status: DC | PRN
  Filled 2016-11-30: qty 5

## 2016-11-30 MED ORDER — PROPOFOL 500 MG/50ML IV EMUL
INTRAVENOUS | Status: DC | PRN
  Administered 2016-11-30: 75 ug/kg/min via INTRAVENOUS

## 2016-11-30 MED ORDER — DEXAMETHASONE SODIUM PHOSPHATE 10 MG/ML IJ SOLN
10.0000 mg | Freq: Once | INTRAMUSCULAR | Status: AC
  Administered 2016-11-30: 10 mg via INTRAVENOUS

## 2016-11-30 MED ORDER — HYDROMORPHONE HCL-NACL 0.5-0.9 MG/ML-% IV SOSY
0.5000 mg | PREFILLED_SYRINGE | INTRAVENOUS | Status: DC | PRN
  Administered 2016-11-30: 0.5 mg via INTRAVENOUS
  Filled 2016-11-30: qty 1

## 2016-11-30 MED ORDER — LOSARTAN POTASSIUM 50 MG PO TABS
100.0000 mg | ORAL_TABLET | Freq: Every day | ORAL | Status: DC
  Administered 2016-11-30 – 2016-12-01 (×2): 100 mg via ORAL
  Filled 2016-11-30 (×2): qty 2

## 2016-11-30 MED ORDER — BISACODYL 10 MG RE SUPP: 10.0000 mg | Freq: Every day | RECTAL | Status: DC | PRN

## 2016-11-30 MED ORDER — INSULIN ASPART 100 UNIT/ML ~~LOC~~ SOLN
0.0000 [IU] | Freq: Three times a day (TID) | SUBCUTANEOUS | Status: DC
  Administered 2016-11-30: 5 [IU] via SUBCUTANEOUS
  Administered 2016-12-01: 3 [IU] via SUBCUTANEOUS

## 2016-11-30 SURGICAL SUPPLY — 31 items
BAG ZIPLOCK 12X15 (MISCELLANEOUS) ×3 IMPLANT
BLADE SAG 18X100X1.27 (BLADE) ×3 IMPLANT
CAPT HIP TOTAL 2 ×3 IMPLANT
CLOTH BEACON ORANGE TIMEOUT ST (SAFETY) ×3 IMPLANT
COVER PERINEAL POST (MISCELLANEOUS) ×3 IMPLANT
COVER SURGICAL LIGHT HANDLE (MISCELLANEOUS) ×3 IMPLANT
DERMABOND ADVANCED (GAUZE/BANDAGES/DRESSINGS) ×2
DERMABOND ADVANCED .7 DNX12 (GAUZE/BANDAGES/DRESSINGS) ×1 IMPLANT
DRAPE STERI IOBAN 125X83 (DRAPES) ×3 IMPLANT
DRAPE U-SHAPE 47X51 STRL (DRAPES) ×6 IMPLANT
DRESSING AQUACEL AG SP 3.5X10 (GAUZE/BANDAGES/DRESSINGS) ×1 IMPLANT
DRSG AQUACEL AG SP 3.5X10 (GAUZE/BANDAGES/DRESSINGS) ×3
DURAPREP 26ML APPLICATOR (WOUND CARE) ×3 IMPLANT
ELECT REM PT RETURN 15FT ADLT (MISCELLANEOUS) ×3 IMPLANT
GLOVE BIOGEL M STRL SZ7.5 (GLOVE) ×6 IMPLANT
GLOVE BIOGEL PI IND STRL 7.5 (GLOVE) ×7 IMPLANT
GLOVE BIOGEL PI INDICATOR 7.5 (GLOVE) ×14
GLOVE ORTHO TXT STRL SZ7.5 (GLOVE) ×3 IMPLANT
GLOVE SURG SS PI 7.0 STRL IVOR (GLOVE) ×3 IMPLANT
GOWN STRL REUS W/TWL LRG LVL3 (GOWN DISPOSABLE) ×3 IMPLANT
GOWN STRL REUS W/TWL XL LVL3 (GOWN DISPOSABLE) ×9 IMPLANT
HOLDER FOLEY CATH W/STRAP (MISCELLANEOUS) ×3 IMPLANT
PACK ANTERIOR HIP CUSTOM (KITS) ×3 IMPLANT
SUT MNCRL AB 4-0 PS2 18 (SUTURE) ×3 IMPLANT
SUT STRATAFIX 0 PDS 27 VIOLET (SUTURE) ×3
SUT VIC AB 1 CT1 36 (SUTURE) ×9 IMPLANT
SUT VIC AB 2-0 CT1 27 (SUTURE) ×6
SUT VIC AB 2-0 CT1 TAPERPNT 27 (SUTURE) ×3 IMPLANT
SUTURE STRATFX 0 PDS 27 VIOLET (SUTURE) ×1 IMPLANT
TRAY FOLEY W/METER SILVER 16FR (SET/KITS/TRAYS/PACK) ×3 IMPLANT
YANKAUER SUCT BULB TIP 10FT TU (MISCELLANEOUS) ×3 IMPLANT

## 2016-11-30 NOTE — Interval H&P Note (Signed)
History and Physical Interval Note:  11/30/2016 7:28 AM  [name] [name]  has presented today for surgery, with the diagnosis of Left hip osteoarthritis  The various methods of treatment have been discussed with the patient and family. After consideration of risks, benefits and other options for treatment, the patient has consented to  Procedure(s) with comments: LEFT TOTAL HIP ARTHROPLASTY ANTERIOR APPROACH (Left) - 70 mins as a surgical intervention .  The patient's history has been reviewed, patient examined, no change in status, stable for surgery.  I have reviewed the patient's chart and labs.  Questions were answered to the patient's satisfaction.     Shelda Pal

## 2016-11-30 NOTE — Discharge Instructions (Signed)

## 2016-11-30 NOTE — Anesthesia Procedure Notes (Signed)
Spinal  Patient location during procedure: OR End time: 11/30/2016 8:42 AM Staffing Anesthesiologist: Bethena Midget Resident/CRNA: Orest Dikes Performed: resident/CRNA  Preanesthetic Checklist Completed: patient identified, site marked, surgical consent, pre-op evaluation, timeout performed, IV checked, risks and benefits discussed and monitors and equipment checked Spinal Block Patient position: sitting Prep: DuraPrep Patient monitoring: heart rate, continuous pulse ox and blood pressure Approach: midline Location: L3-4 Injection technique: single-shot Needle Needle type: Pencan  Needle gauge: 24 G Needle length: 10 cm Additional Notes Patient sitting for SAB placement. Dr Tacy Dura at bedside during entire SAB placement. Single shot, CSF clear and free flow. Negative heme/paresthesia.

## 2016-11-30 NOTE — Evaluation (Signed)
Physical Therapy Evaluation Patient Details Name: [name] [name] MRN: [id number] DOB: [birth year] Today's Date: 11/30/2016   History of Present Illness  66 y.o. male admitted for L DA-THA. PMH of R DA-THA 07/13/16  Clinical Impression  Pt is s/p THA resulting in the deficits listed below (see PT Problem List). Pt ambulated 50' with RW and performed THA exercises with min A. Good progress expected.  Pt will benefit from skilled PT to increase their independence and safety with mobility to allow discharge to the venue listed below.      Follow Up Recommendations DC plan and follow up therapy as arranged by surgeon    Equipment Recommendations  None recommended by PT    Recommendations for Other Services       Precautions / Restrictions Precautions Precautions: Fall Restrictions Weight Bearing Restrictions: No Other Position/Activity Restrictions: WBAT      Mobility  Bed Mobility Overal bed mobility: Needs Assistance Bed Mobility: Supine to Sit     Supine to sit: Mod assist     General bed mobility comments: mod A for LLE and to pivot hips to EOB with pad  Transfers Overall transfer level: Needs assistance Equipment used: Rolling walker (2 wheeled) Transfers: Sit to/from Stand Sit to Stand: Min assist         General transfer comment: VCs hand placement  Ambulation/Gait Ambulation/Gait assistance: Min guard Ambulation Distance (Feet): 50 Feet Assistive device: Rolling walker (2 wheeled) Gait Pattern/deviations: Step-to pattern;Decreased stride length;Step-through pattern   Gait velocity interpretation: Below normal speed for age/gender General Gait Details: VCs sequencing, no LOB  Stairs            Wheelchair Mobility    Modified Rankin (Stroke Patients Only)       Balance Overall balance assessment: Modified Independent                                           Pertinent Vitals/Pain Pain Assessment: 0-10 Pain Score: 5   Pain Location: L hip with walking Pain Descriptors / Indicators: Sore Pain Intervention(s): Limited activity within patient's tolerance;Monitored during session;Premedicated before session;Ice applied    Home Living Family/patient expects to be discharged to:: Private residence Living Arrangements: Spouse/significant other Available Help at Discharge: Family;Available 24 hours/day Type of Home: House Home Access: Stairs to enter Entrance Stairs-Rails: Right Entrance Stairs-Number of Steps: 2 Home Layout: One level;Laundry or work area in [employer] Equipment: Environmental consultant - standard;Cane - single point Additional Comments: vanity next to toilet    Prior Function Level of Independence: Independent               Hand Dominance        Extremity/Trunk Assessment   Upper Extremity Assessment Upper Extremity Assessment: Overall WFL for tasks assessed    Lower Extremity Assessment Lower Extremity Assessment: LLE deficits/detail LLE Deficits / Details: knee ext -4/5, hip ABD AAROM ~15* limited by pain    Cervical / Trunk Assessment Cervical / Trunk Assessment: Normal  Communication   Communication: No difficulties  Cognition Arousal/Alertness: Awake/alert Behavior During Therapy: WFL for tasks assessed/performed Overall Cognitive Status: Within Functional Limits for tasks assessed                                        General Comments  Exercises Total Joint Exercises Ankle Circles/Pumps: AROM;Both;10 reps Quad Sets: AROM;Left;5 reps;Supine Heel Slides: AAROM;Left;10 reps;Supine Hip ABduction/ADduction: AAROM;Left;10 reps;Supine Long Arc Quad: AROM;Left;5 reps;Seated   Assessment/Plan    PT Assessment Patient needs continued PT services  PT Problem List Decreased strength;Decreased range of motion;Decreased activity tolerance;Pain;Decreased mobility       PT Treatment Interventions DME instruction;Gait training;Stair training;Functional  mobility training;Therapeutic exercise;Therapeutic activities;Patient/family education    PT Goals (Current goals can be found in the Care Plan section)  Acute Rehab PT Goals 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 : Unable Difficulty sitting down on and standing up from a chair with arms (e.g., wheelchair, bedside commode, etc,.)?: Unable Help needed moving to and from a bed to chair (including a wheelchair)?: A Little Help needed walking in hospital room?: A Little Help needed climbing 3-5 steps with a railing? : A Lot 6 Click Score: 13    End of Session Equipment Utilized During Treatment: Gait belt Activity Tolerance: Patient tolerated treatment well Patient left: in chair;with call bell/phone within reach;with family/visitor present Nurse Communication: Mobility status PT Visit Diagnosis: Difficulty in walking, not elsewhere classified (R26.2);Pain Pain - Right/Left: Left Pain - part of body: Hip    Time: 4098-1191 PT Time Calculation (min) (ACUTE ONLY): 26 min   Charges:   PT Evaluation $PT Eval Low Complexity: 1 Low PT Treatments $Gait Training: 8-22 mins   PT G Codes:          Tamala Ser 11/30/2016, 2:13 PM [phone]

## 2016-11-30 NOTE — Anesthesia Postprocedure Evaluation (Signed)
Anesthesia Post Note  Patient: DYSHAWN CANGELOSI  Procedure(s) Performed: LEFT TOTAL HIP ARTHROPLASTY ANTERIOR APPROACH (Left Hip)     Patient location during evaluation: PACU Anesthesia Type: Spinal Level of consciousness: oriented and awake and alert Pain management: pain level controlled Vital Signs Assessment: post-procedure vital signs reviewed and stable Respiratory status: spontaneous breathing, respiratory function stable and patient connected to nasal cannula oxygen Cardiovascular status: blood pressure returned to baseline and stable Postop Assessment: no headache, no backache and no apparent nausea or vomiting Anesthetic complications: no    Last Vitals:  Vitals:   11/30/16 1130 11/30/16 1150  BP: 127/74 (!) 166/88  Pulse: (!) 57 (!) 58  Resp: 14 16  Temp: 36.4 C 36.5 C  SpO2: 97% 100%    Last Pain:  Vitals:   11/30/16 1150  TempSrc: Oral  PainSc: 0-No pain                 Hildagard Sobecki

## 2016-11-30 NOTE — Transfer of Care (Signed)
Immediate Anesthesia Transfer of Care Note  Patient: [name] [name]  Procedure(s) Performed: Procedure(s) with comments: LEFT TOTAL HIP ARTHROPLASTY ANTERIOR APPROACH (Left) - 70 mins  Patient Location: PACU  Anesthesia Type:Spinal  Level of Consciousness:  sedated, patient cooperative and responds to stimulation  Airway & Oxygen Therapy:Patient Spontanous Breathing and Patient connected to face mask oxgen  Post-op Assessment:  Report given to PACU RN and Post -op Vital signs reviewed and stable  Post vital signs:  Reviewed and stable  Last Vitals:  Vitals:   11/30/16 0741  BP: 138/84  Pulse: 61  Resp: 18  Temp: 37 C  SpO2: 98%    Complications: No apparent anesthesia complications

## 2016-11-30 NOTE — Op Note (Signed)
NAME:  [name] [name]                ACCOUNT NO.: [id number]      MEDICAL RECORD NO.: [id number]      FACILITY:  [facility]      PHYSICIAN:  Durene Romans D  DATE OF BIRTH:  [birth year]     DATE OF PROCEDURE:  11/30/2016                                 OPERATIVE REPORT         PREOPERATIVE DIAGNOSIS: Left  hip osteoarthritis.      POSTOPERATIVE DIAGNOSIS:  Left hip osteoarthritis.      PROCEDURE:  Left total hip replacement through an anterior approach   utilizing DePuy THR system, component size 56mm pinnacle cup, a size 36+4 neutral   Altrex liner, a size 8 Hi Tri Lock stem with a 36+5 delta ceramic   ball.      SURGEON:  Madlyn Frankel. Charlann Boxer, M.D.      ASSISTANT:  Skip Mayer, PA-C     ANESTHESIA:  Spinal.      SPECIMENS:  None.      COMPLICATIONS:  None.      BLOOD LOSS:  250 cc     DRAINS:  None.      INDICATION OF THE PROCEDURE:  [name] [name] is a 66 y.o. male who had   presented to office for evaluation of left hip pain.  History of recent right total hip replacement. Radiographs revealed   progressive degenerative changes with bone-on-bone   articulation to the  hip joint.  The patient had painful limited range of   motion significantly affecting their overall quality of life.  The patient was failing to    respond to conservative measures, and at this point was ready   to proceed with more definitive measures.  The patient has noted progressive   degenerative changes in his hip, progressive problems and dysfunction   with regarding the hip prior to surgery.  Consent was obtained for   benefit of pain relief.  Specific risk of infection, DVT, component   failure, dislocation, need for revision surgery, as well discussion of   the anterior versus posterior approach were reviewed.  Consent was   obtained for benefit of anterior pain relief through an anterior   approach.      PROCEDURE IN DETAIL:  The patient was brought to operative theater.   Once adequate anesthesia, preoperative antibiotics, 2 gm of Ancef, 1 gm of Tranexamic Acid, and 10 mg of Decadron administered.   The patient was positioned supine on the OSI Hanna table.  Once adequate   padding of boney process was carried out, we had predraped out the hip, and  used fluoroscopy to confirm orientation of the pelvis and position.      The left hip was then prepped and draped from proximal iliac crest to   mid thigh with shower curtain technique.      Time-out was performed identifying the patient, planned procedure, and   extremity.     An incision was then made 2 cm distal and lateral to the   anterior superior iliac spine extending over the orientation of the   tensor fascia lata muscle and sharp dissection was carried down to the   fascia of the muscle and protractor placed in the soft tissues.  The fascia was then incised.  The muscle belly was identified and swept   laterally and retractor placed along the superior neck.  Following   cauterization of the circumflex vessels and removing some pericapsular   fat, a second cobra retractor was placed on the inferior neck.  A third   retractor was placed on the anterior acetabulum after elevating the   anterior rectus.  A L-capsulotomy was along the line of the   superior neck to the trochanteric fossa, then extended proximally and   distally.  Tag sutures were placed and the retractors were then placed   intracapsular.  We then identified the trochanteric fossa and   orientation of my neck cut, confirmed this radiographically   and then made a neck osteotomy with the femur on traction.  The femoral   head was removed without difficulty or complication.  Traction was let   off and retractors were placed posterior and anterior around the   acetabulum.      The labrum and foveal tissue were debrided.  I began reaming with a 48mm   reamer and reamed up to 55mm reamer with good bony bed preparation and a 56mm   cup  was chosen.  The final 56mm Pinnacle cup was then impacted under fluoroscopy  to confirm the depth of penetration and orientation with respect to   abduction.  A screw was placed followed by the hole eliminator.  The final   36+4 neutral Altrex liner was impacted with good visualized rim fit.  The cup was positioned anatomically within the acetabular portion of the pelvis.      At this point, the femur was rolled at 80 degrees.  Further capsule was   released off the inferior aspect of the femoral neck.  I then   released the superior capsule proximally.  The hook was placed laterally   along the femur and elevated manually and held in position with the bed   hook.  The leg was then extended and adducted with the leg rolled to 100   degrees of external rotation.  Once the proximal femur was fully   exposed, I used a box osteotome to set orientation.  I then began   broaching with the starting chili pepper broach and passed this by hand and then broached up to 8.  With the 8 broach in place I chose a high offset neck and did several trial reductions.  The offset and leg lengths   appeared to be best matched to the other hip with the +5 head ball, matching what we had used on that side, confirmed radiographically.   Given these findings, I went ahead and dislocated the hip, repositioned all   retractors and positioned the right hip in the extended and abducted position.  The final 8 Hi Tri Lock stem was   chosen and it was impacted down to the level of neck cut.  Based on this   and the trial reduction, a 36+5 delta ceramic ball was chosen and   impacted onto a clean and dry trunnion, and the hip was reduced.  The   hip had been irrigated throughout the case again at this point.  I did   reapproximate the superior capsular leaflet to the anterior leaflet   using #1 Vicryl.  The fascia of the   tensor fascia lata muscle was then reapproximated using #1 Vicryl and #0 Stratafix sutures.  The    remaining wound was closed with 2-0  Vicryl and running 4-0 Monocryl.   The hip was cleaned, dried, and dressed sterilely using Dermabond and   Aquacel dressing.  He was then brought   to recovery room in stable condition tolerating the procedure well.    Skip Mayer, PA-C was present for the entirety of the case involved from   preoperative positioning, perioperative retractor management, general   facilitation of the case, as well as primary wound closure as assistant.            Madlyn Frankel Charlann Boxer, M.D.        11/30/2016 1:14 PM

## 2016-12-01 LAB — BASIC METABOLIC PANEL
Anion gap: 6 (ref 5–15)
BUN: 17 mg/dL (ref 6–20)
CO2: 27 mmol/L (ref 22–32)
Calcium: 8.4 mg/dL — ABNORMAL LOW (ref 8.9–10.3)
Chloride: 101 mmol/L (ref 101–111)
Creatinine, Ser: 0.84 mg/dL (ref 0.61–1.24)
GFR calc Af Amer: >60 mL/min (ref >60)
GLUCOSE: 248 mg/dL — AB (ref 65–99)
POTASSIUM: 4 mmol/L (ref 3.5–5.1)
Sodium: 134 mmol/L — ABNORMAL LOW (ref 135–145)

## 2016-12-01 LAB — CBC
HEMATOCRIT: 37.7 % — AB (ref 39.0–52.0)
Hemoglobin: 12.5 g/dL — ABNORMAL LOW (ref 13.0–17.0)
MCH: 27.1 pg (ref 26.0–34.0)
MCHC: 33.2 g/dL (ref 30.0–36.0)
MCV: 81.8 fL (ref 78.0–100.0)
PLATELETS: 224 10*3/uL (ref 150–400)
RBC: 4.61 MIL/uL (ref 4.22–5.81)
RDW: 13.5 % (ref 11.5–15.5)
WBC: 12.9 10*3/uL — ABNORMAL HIGH (ref 4.0–10.5)

## 2016-12-01 LAB — GLUCOSE, CAPILLARY: Glucose-Capillary: 196 mg/dL — ABNORMAL HIGH (ref 65–99)

## 2016-12-01 NOTE — Progress Notes (Signed)
Physical Therapy Treatment Patient Details Name: [name] [name] MRN: [id number] DOB: 01-28-1951 Today's Date: 12/01/2016    History of Present Illness 66 y.o. male admitted for L DA-THA. PMH of R DA-THA 07/13/16    PT Comments    Pt progressing very well with mobility, he ambulated 360', completed stair training, and demonstrated understanding of standing hip exercises for HEP. Encouraged frequent ambulation upon DC. He is ready to DC home from PT standpoint, PT goals met, will sign off.    Follow Up Recommendations  DC plan and follow up therapy as arranged by surgeon     Equipment Recommendations  None recommended by PT    Recommendations for Other Services       Precautions / Restrictions Precautions Precautions: Fall Restrictions Weight Bearing Restrictions: No Other Position/Activity Restrictions: WBAT    Mobility  Bed Mobility Overal bed mobility: Modified Independent       Supine to sit: Modified independent (Device/Increase time);HOB elevated     General bed mobility comments: used rail  Transfers Overall transfer level: Modified independent Equipment used: Rolling walker (2 wheeled) Transfers: Sit to/from Stand              Ambulation/Gait Ambulation/Gait assistance: Modified independent (Device/Increase time) Ambulation Distance (Feet): 360 Feet Assistive device: Rolling walker (2 wheeled) Gait Pattern/deviations: Step-through pattern;Decreased stride length     General Gait Details: steady, no LOB, mild pain L hip   Stairs Stairs: Yes   Stair Management: Step to pattern;Forwards;Two rails Number of Stairs: 5 General stair comments: VCs sequencing, steady  Wheelchair Mobility    Modified Rankin (Stroke Patients Only)       Balance Overall balance assessment: Modified Independent                                          Cognition Arousal/Alertness: Awake/alert Behavior During Therapy: WFL for tasks  assessed/performed Overall Cognitive Status: Within Functional Limits for tasks assessed                                        Exercises Total Joint Exercises Hip ABduction/ADduction: AROM;Left;10 reps;Standing Long Arc Quad: AROM;Left;Seated;10 reps Knee Flexion: AROM;Left;10 reps;Standing Marching in Standing: AROM;Left;10 reps;Standing Standing Hip Extension: AROM;Left;10 reps;Standing    General Comments        Pertinent Vitals/Pain Pain Score: 3  Pain Location: L hip with walking Pain Descriptors / Indicators: Aching;Dull Pain Intervention(s): Limited activity within patient's tolerance;Monitored during session;Premedicated before session;Ice applied    Home Living                      Prior Function            PT Goals (current goals can now be found in the care plan section) Acute Rehab PT Goals Patient Stated Goal: 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 : A Little Difficulty sitting down on and standing up from a chair with arms (e.g., wheelchair, bedside commode, etc,.)?: None Help needed moving to and from a bed to chair (including a wheelchair)?: None Help needed walking in hospital room?: None Help needed climbing 3-5 steps with a railing? : A Little 6 Click Score: 22    End of Session Equipment Utilized During Treatment: Gait belt Activity Tolerance: Patient tolerated treatment well Patient left: in  chair;with call bell/phone within reach;with family/visitor present Nurse Communication: Mobility status PT Visit Diagnosis: Difficulty in walking, not elsewhere classified (R26.2);Pain Pain - Right/Left: Left Pain - part of body: Hip     Time: 0355-9741 PT Time Calculation (min) (ACUTE ONLY): 29 min  Charges:  $Gait Training: 8-22 mins $Therapeutic Exercise: 8-22 mins                    G Codes:          Philomena Doheny 12/01/2016, 9:29 AM [phone]

## 2016-12-01 NOTE — Discharge Summary (Signed)
Physician Discharge Summary  Patient ID: [name] [name] MRN: [id number] DOB/AGE: Oct 30, 1950 66 y.o.  Admit date: 11/30/2016 Discharge date: 12/01/2016   Procedures:  Procedure(s) (LRB): LEFT TOTAL HIP ARTHROPLASTY ANTERIOR APPROACH (Left)  Attending Physician:  Dr. Durene Romans   Admission Diagnoses:   Left hip primary OA / pain  Discharge Diagnoses:  Principal Problem:   S/P left THA, AA  Past Medical History:  Diagnosis Date  . Arthritis   . Diabetes mellitus without complication (HCC)    type 2  . Dyspnea    with excertion  . Headache    rare  . Hypertension   . Pneumonia    as a child    HPI:    [name] [name], 66 y.o. male, has a history of pain and functional disability in the left hip(s) due to arthritis and patient has failed non-surgical conservative treatments for greater than 12 weeks to include NSAID's and/or analgesics, corticosteriod injections, supervised PT with diminished ADL's post treatment and activity modification.  Onset of symptoms was gradual starting ~4 years ago with gradually worsening course since that time.The patient noted prior procedures of the hip to include arthroplasty on the right hip per Dr. Charlann Boxer on 07/13/2016.  Patient currently rates pain in the left hip at 7 out of 10 with activity. Patient has night pain, worsening of pain with activity and weight bearing, trendelenberg gait, pain that interfers with activities of daily living and pain with passive range of motion. Patient has evidence of periarticular osteophytes and joint space narrowing by imaging studies. This condition presents safety issues increasing the risk of falls. There is no current active infection.   Risks, benefits and expectations were discussed with the patient.  Risks including but not limited to the risk of anesthesia, blood clots, nerve damage, blood vessel damage, failure of the prosthesis, infection and up to and including death.  Patient understand the risks, benefits  and expectations and wishes to proceed with surgery.   PCP: Alan Mulder, MD   Discharged Condition: good  Hospital Course:  Patient underwent the above stated procedure on 11/30/2016. Patient tolerated the procedure well and brought to the recovery room in good condition and subsequently to the floor.  POD #1 BP: 142/76 ; Pulse: 55 ; Temp: 97.5 F (36.4 C) ; Resp: 17 Patient reports pain as mild to moderate.  No events overnight.  Reviewed questions with him and his wife. Interested to go home today. Neurovascular intact and incision: dressing C/D/I.  LABS  Basename    HGB     12.5  HCT     37.7    Discharge Exam: General appearance: alert, cooperative and no distress Extremities: Homans sign is negative, no sign of DVT, no edema, redness or tenderness in the calves or thighs and no ulcers, gangrene or trophic changes  Disposition: Home with follow up in 2 weeks   Follow-up Information    Durene Romans, MD. Schedule an appointment as soon as possible for a visit in 2 week(s).   Specialty:  Orthopedic Surgery Contact information: [address] [place] [address] [phone]           Discharge Instructions    Call MD / Call 911    Complete by:  As directed    If you experience chest pain or shortness of breath, CALL 911 and be transported to the hospital emergency room.  If you develope a fever above 101 F, pus (white drainage) or  increased drainage or redness at the wound, or calf pain, call your surgeon's office.   Change dressing    Complete by:  As directed    Maintain surgical dressing until follow up in the clinic. If the edges start to pull up, may reinforce with tape. If the dressing is no longer working, may remove and cover with gauze and tape, but must keep the area dry and clean.  Call with any questions or concerns.   Constipation Prevention    Complete by:  As directed    Drink plenty of fluids.  Prune juice may be helpful.   You may use a stool softener, such as Colace (over the counter) 100 mg twice a day.  Use MiraLax (over the counter) for constipation as needed.   Diet - low sodium heart healthy    Complete by:  As directed    Discharge instructions    Complete by:  As directed    Maintain surgical dressing until follow up in the clinic. If the edges start to pull up, may reinforce with tape. If the dressing is no longer working, may remove and cover with gauze and tape, but must keep the area dry and clean.  Follow up in 2 weeks at [facility]. Call with any questions or concerns.   Increase activity slowly as tolerated    Complete by:  As directed    Weight bearing as tolerated with assist device (walker, cane, etc) as directed, use it as long as suggested by your surgeon or therapist, typically at least 4-6 weeks.   TED hose    Complete by:  As directed    Use stockings (TED hose) for 2 weeks on both leg(s).  You may remove them at night for sleeping.      Allergies as of 12/01/2016   No Known Allergies     Medication List    STOP taking these medications   acetaminophen 500 MG tablet Commonly known as:  TYLENOL   rivaroxaban 10 MG Tabs tablet Commonly known as:  XARELTO     TAKE these medications   APIDRA 100 UNIT/ML injection Generic drug:  insulin glulisine Inject 20-60 Units into the skin 3 (three) times daily before meals. Based on sliding scale   aspirin 81 MG chewable tablet Commonly known as:  ASPIRIN CHILDRENS Chew 1 tablet (81 mg total) by mouth 2 (two) times daily. Take for 4 weeks.   cloNIDine 0.3 MG tablet Commonly known as:  CATAPRES Take 0.3 mg by mouth daily.   docusate sodium 100 MG capsule Commonly known as:  COLACE Take 1 capsule (100 mg total) by mouth 2 (two) times daily.   doxazosin 8 MG tablet Commonly known as:  CARDURA Take 8 mg by mouth daily.   exenatide 10 MCG/0.04ML Sopn injection Commonly known as:  BYETTA Inject 10 mcg into the skin 2  (two) times daily with a meal.   ferrous sulfate 325 (65 FE) MG tablet Commonly known as:  FERROUSUL Take 1 tablet (325 mg total) by mouth 3 (three) times daily with meals.   HYDROcodone-acetaminophen 7.5-325 MG tablet Commonly known as:  NORCO Take 1-2 tablets by mouth every 4 (four) hours as needed for moderate pain or severe pain.   insulin glargine 100 UNIT/ML injection Commonly known as:  LANTUS Inject 40 Units into the skin every evening.   losartan-hydrochlorothiazide 100-25 MG tablet Commonly known as:  HYZAAR Take 1 tablet by mouth daily.   Magnesium Oxide 500 MG Caps  Take 500 mg by mouth 2 (two) times daily.   methocarbamol 500 MG tablet Commonly known as:  ROBAXIN Take 1 tablet (500 mg total) by mouth every 6 (six) hours as needed for muscle spasms.   polyethylene glycol packet Commonly known as:  MIRALAX / GLYCOLAX Take 17 g by mouth 2 (two) times daily.   ranitidine 150 MG capsule Commonly known as:  ZANTAC Take 150 mg by mouth daily as needed for heartburn.   sitaGLIPtin-metformin 50-1000 MG tablet Commonly known as:  JANUMET Take 1 tablet by mouth 2 (two) times daily with a meal.   Vitamin D (Ergocalciferol) 50000 units Caps capsule Commonly known as:  DRISDOL Take 50,000 Units by mouth every 7 (seven) days. sundays            Discharge Care Instructions        Start     Ordered   12/01/16 0000  Change dressing    Comments:  Maintain surgical dressing until follow up in the clinic. If the edges start to pull up, may reinforce with tape. If the dressing is no longer working, may remove and cover with gauze and tape, but must keep the area dry and clean.  Call with any questions or concerns.   12/01/16 4098       Signed: Anastasio Auerbach. Iyanna Drummer   PA-C  12/01/2016, 12:48 PM

## 2016-12-01 NOTE — Progress Notes (Signed)
Patient ID: Nathaniel ManKevin L Mankey, male   DOB: November 20, 1950, 66 y.o.   MRN: [id number] Subjective: 1 Day Post-Op Procedure(s) (LRB): LEFT TOTAL HIP ARTHROPLASTY ANTERIOR APPROACH (Left)    Patient reports pain as mild to moderate.  No events overnight.  Reviewed questions with him and his wife. Interested to go home today  Objective:   VITALS:   Vitals:   12/01/16 0055 12/01/16 0539  BP: 136/75 (!) 142/76  Pulse: 60 (!) 55  Resp: 15 17  Temp: 97.7 F (36.5 C) (!) 97.5 F (36.4 C)  SpO2: 97% 98%    Neurovascular intact Incision: dressing C/D/I  LABS  Recent Labs  12/01/16 0625  HGB 12.5*  HCT 37.7*  WBC 12.9*  PLT 224     Recent Labs  12/01/16 0625  NA 134*  K 4.0  BUN 17  CREATININE 0.84  GLUCOSE 248*    No results for input(s): LABPT, INR in the last 72 hours.   Assessment/Plan: 1 Day Post-Op Procedure(s) (LRB): LEFT TOTAL HIP ARTHROPLASTY ANTERIOR APPROACH (Left)   Advance diet Up with therapy  Home today after therapy RTC in 2 weeks

## 2016-12-01 NOTE — Progress Notes (Signed)
OT Cancellation Note  Patient Details Name: [name] [name] MRN: [id number] DOB: 1950-09-29   Cancelled Treatment:    Reason Eval/Treat Not Completed: OT screened, no needs identified, will sign off  [name] [name] 12/01/2016, 10:29 AM  Marica OtterMaryellen Gwenivere Hiraldo, OTR/L [phone] 12/01/2016

## 2017-12-01 LAB — HM DEXA SCAN: HM Dexa Scan: NORMAL

## 2019-05-09 IMAGING — DX DG PORTABLE PELVIS
1 series · 1 of 1 positions shown · non-contrast
Comparison: None.

CLINICAL DATA: Status post left hip replacement

EXAM:
PORTABLE PELVIS 1-2 VIEWS

[pelvis ap]
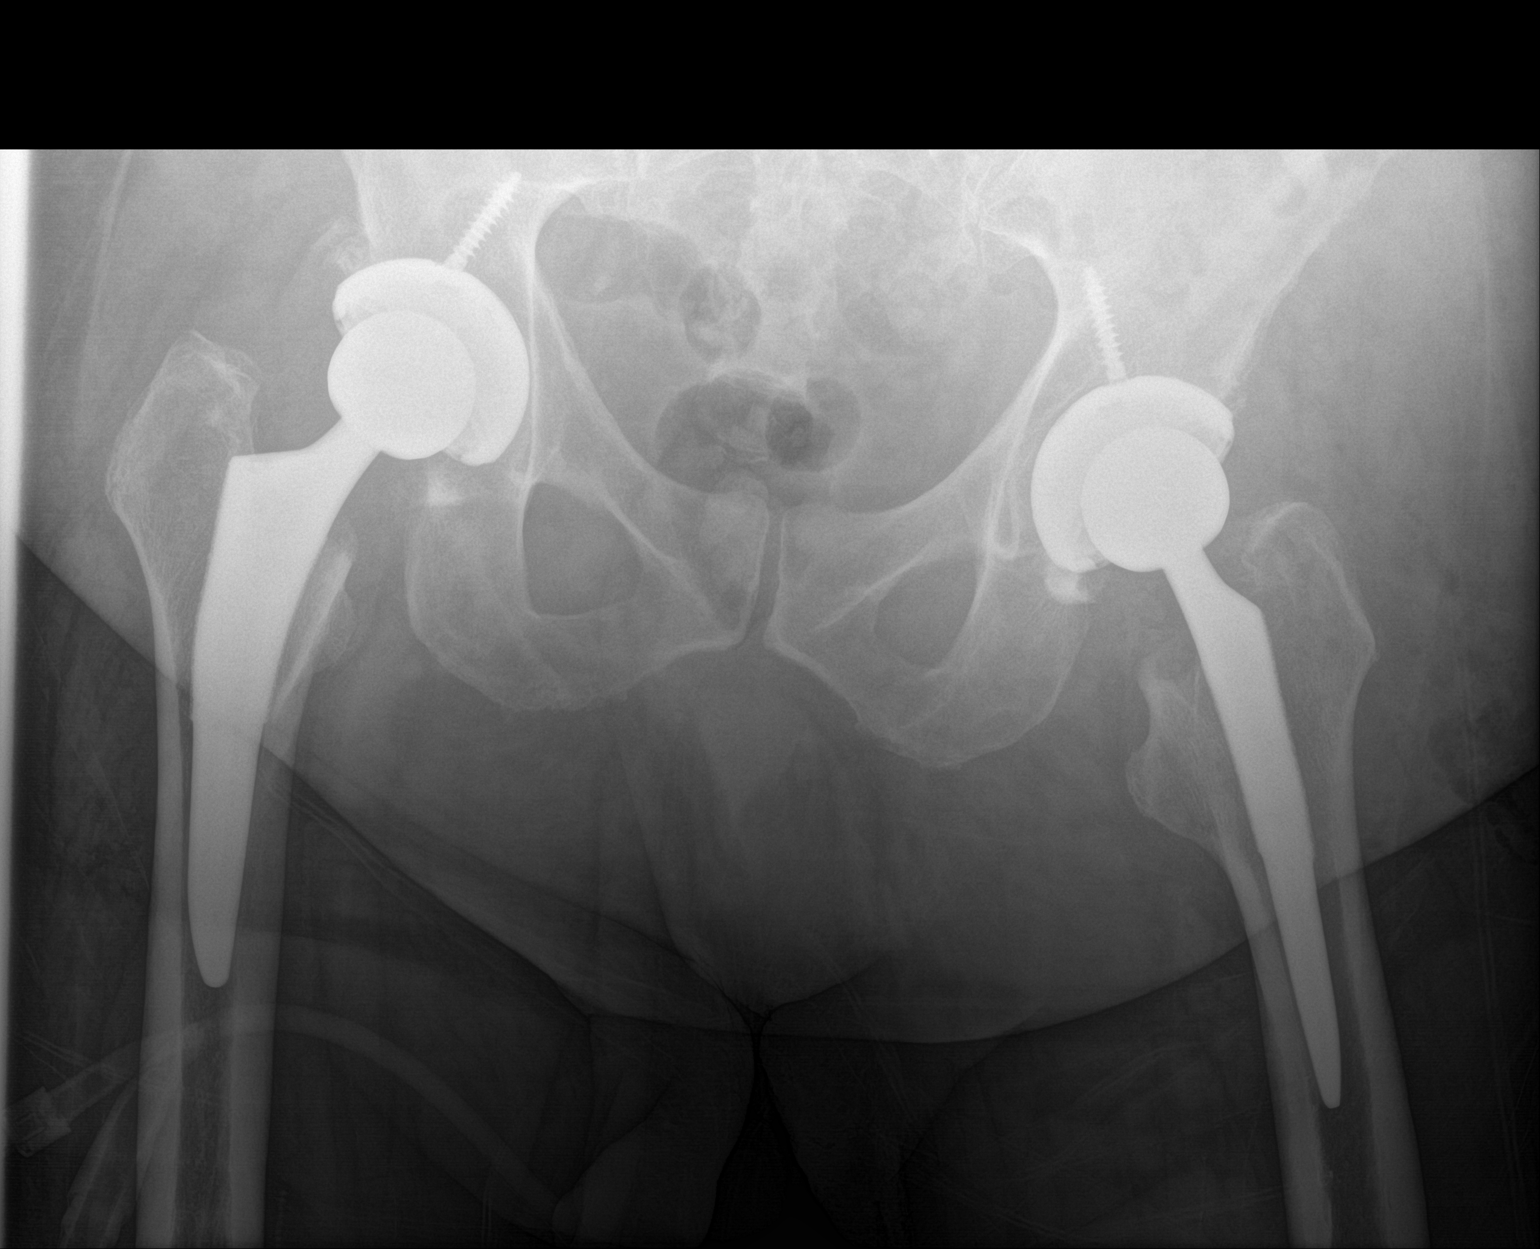

[1 of 1 positions shown; findings below may reference images not displayed]

FINDINGS: Interval left total hip arthroplasty without failure or
complication. No fracture or dislocation. Postsurgical changes in
the surrounding soft tissues.

Prior right hip arthroplasty without failure or complication.
IMPRESSION: Interval left total hip arthroplasty.

## 2021-10-08 ENCOUNTER — Ambulatory Visit: Payer: Medicare Other | Attending: Family Medicine

## 2021-10-08 DIAGNOSIS — M542 Cervicalgia: Secondary | ICD-10-CM | POA: Insufficient documentation

## 2021-10-08 DIAGNOSIS — M5412 Radiculopathy, cervical region: Secondary | ICD-10-CM | POA: Insufficient documentation

## 2021-10-08 NOTE — Therapy (Signed)
[facility]IC[facility] [address], [place], [address] Phone: [phone]   Fax:  [phone]  Physical Therapy Evaluation  Patient Details  Name: [name] [name] MRN: [id number] Date of Birth: [birth year] Referring Provider (PT): Alan Mulder, MD   Encounter Date: 10/08/2021   PT End of Session - 10/08/21 0803     Visit Number 1    Number of Visits 17    Date for PT Re-Evaluation 12/03/21    PT Start Time 0803    PT Stop Time 0849    PT Time Calculation (min) 46 min    Activity Tolerance Patient tolerated treatment well    Behavior During Therapy [facility] for tasks assessed/performed             Past Medical History:  Diagnosis Date   Arthritis    Diabetes mellitus without complication (HCC)    type 2   Dyspnea    with excertion   Headache    rare   Hypertension    Pneumonia    as a child    Past Surgical History:  Procedure Laterality Date   BACK SURGERY     2005   cartarized     bleeding ulcer  2015   JOINT REPLACEMENT     Right total hip 5/29 2018   REPAIR QUADRICEPS / HAMSTRING MUSCLE     torn loose from knee 2009 5/25   TOTAL HIP ARTHROPLASTY Right 07/13/2016   Procedure: RIGHT TOTAL HIP ARTHROPLASTY ANTERIOR APPROACH;  Surgeon: Durene Romans, MD;  Location: [facility];  Service: Orthopedics;  Laterality: Right;   TOTAL HIP ARTHROPLASTY Left 11/30/2016   Procedure: LEFT TOTAL HIP ARTHROPLASTY ANTERIOR APPROACH;  Surgeon: Durene Romans, MD;  Location: [facility];  Service: Orthopedics;  Laterality: Left;  70 mins    There were no vitals filed for this visit.    Subjective Assessment - 10/08/21 0806     Subjective L hand numbness: currently at digits 2-5.  Neck pain 3/10 currently (L side currently), 7/10 at worst for the past 3 months. Posterior headache: 0/10 currently but just pressure, 7/10 at worst for the past 3 months (usually in the mornings but lasts all day at times).    Pertinent  History Neck pain. Feels B UE paresthesia L > R. Keeps him awake at night. Pain began about a couple of years ago, gradual onset. Was laying on his R side a couple of months ago and could not pick up his head. Has not happened since. Pt sleeps on his L side. Sleeping with his L arm to the side makes it numb. Only imaging is an x-ray. Gets pain in B upper trap pain for the past couple of months and also has posterior headaches. Had PT for low back before and L Achilles tendon tear which have improved. Also has B THA. L hand gets numb to the point where he cannot grip. Happens to R hand as well but not as often. Pt is mainly a side sleeper. Sleeps on the back at times. Pt is R hand dominant. Used to sleep on his L side more but not now. Pt sometimes feels off balance when he wakes up in the morning. 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    Patient Stated Goals Stay in bed longer (wakes up at 3-4  am)    Currently in Pain? Yes    Pain Score 3     Pain Location Neck    Pain Orientation Left;Right;Posterior    Pain Type Chronic pain    Pain Onset More than a month ago    Pain Frequency Occasional    Aggravating Factors  L S/L after about 15-20 min causes L UE paresthesia. R S/L for about 15-20 minutes causes R UE paresthesia. First thing in the morning    Pain Relieving Factors Moving his arms.                [facility] PT Assessment - 10/08/21 6433       Assessment   Medical Diagnosis M50.30 (ICD-10-CM) - Degenerative disc disease, cervical    Referring Provider (PT) Patrecia Pace, Delsa Sale, MD    Onset Date/Surgical Date 09/23/21   Date PT referral signed. Chronici condition.   Prior Therapy yes      Precautions   Precaution Comments No known precautions      Restrictions   Other Position/Activity Restrictions No known restrictions      Observation/Other Assessments    Focus on Therapeutic Outcomes (FOTO)  Neck FOTO 72      Posture/Postural Control   Posture Comments forward neck, L shoulder lower, forward neck, movement preference around C2/C3, R lumbar lateral shift,      AROM   Overall AROM Comments Altered B scapular mechanics    Cervical Flexion WFL with posterior neck stretch    Cervical Extension very limited, movement preference around C6/C7 area with reproduction of B upper trap/rhomboid area pain.    Cervical - Right Side Bend limted with L posterior upper cervical pressure (around C2/C3 area)    Cervical - Left Side Bend limited with L posterior upper cervical pressure (around C2/C3 area)    Cervical - Right Rotation WFL    Cervical - Left Rotation WFL with L posterior upper cervical pull around C2/C3      Strength   Overall Strength Comments seated manually resisted scapular retraction targeting lower trap: R 4-/5, L 4/5    Right Shoulder Flexion 4/5   R shoulder shrug and protraction observed   Right Shoulder ABduction 4/5    Left Shoulder Flexion 4/5   with L shoulder shrug   Left Shoulder ABduction 4-/5    Right Elbow Flexion 4+/5    Right Elbow Extension 5/5    Left Elbow Flexion 4/5    Left Elbow Extension 5/5    Right Wrist Extension 4+/5    Left Wrist Extension 4/5      Palpation   Palpation comment B cervical paraspinal muscle tension L > R, R rhomboid and upper trap muscle tension                        Objective measurements completed on examination: See above findings.   Blood pressure is controlled per pt.             Response to treatment Pt tolerated session well without aggravation of symptoms.     Clinical impression  Pt is a 71 year old male who came to physical therapy secondary to neck pain. He also presents with B UE paresthesia L > R, posterior headaches, altered cervical and scapular posture, poor B scapular mechanics, B scapular weakness, B cervical paraspinal muscle tension L >  R, R rhomboid and upper trap muscle tension, movement preference around the C2/C3 and C6/C7 areas, and  difficulty sleeping, as well as laying on his R or L sides secondary to pain. Pt will benefit from skilled physical therapy services to address the aforementioned deficits.                    PT Education - 10/08/21 1003     Education Details plan of care    Person(s) Educated Patient    Methods Explanation    Comprehension Verbalized understanding              PT Short Term Goals - 10/08/21 0957       PT SHORT TERM GOAL #1   Title Pt will be independent with his initial HEP to decrease pain, improve strength, function, and ability to sleep.    Baseline Pt has not yet started his HEP (10/08/2021)    Time 3    Period Weeks    Status New    Target Date 10/29/21               PT Long Term Goals - 10/08/21 0958       PT LONG TERM GOAL #1   Title Pt will have a decrease in neck pain to 2/10 or less at worst to promote ability to sleep/have a full night's rest.    Baseline 7/10 neck pain at worst for the past 3 months (10/08/2021)    Time 8    Period Weeks    Status New    Target Date 12/03/21      PT LONG TERM GOAL #2   Title Pt will report no L hand numbness to promote ability to sleep more comfortably.    Baseline 7/10 L hand numbness at most for the past 3 months (10/08/2021)    Time 8    Period Weeks    Status New    Target Date 12/03/21      PT LONG TERM GOAL #3   Title Pt will improve bilateral lower trap strength to decrease upper trap tension on neck and promote ability to sleep and perform UE tasks more comfortably.    Baseline Seated manually resisted scapular retraction isometrics targeting lower trap: R 4-/5, L 4/5 secondary to pt unable to be on prone position (10/08/2021)    Time 8    Period Weeks    Status New    Target Date 12/03/21      PT LONG TERM GOAL #4   Title Pt will improve his neck FOTO score by at least 10 points as a  demonstration of improved function.    Baseline neck FOTO 72 (10/08/2021)    Time 8    Period Weeks    Status New    Target Date 12/03/21                    Plan - 10/08/21 0952     Clinical Impression Statement Pt is a 71 year old male who came to physical therapy secondary to neck pain. He also presents with B UE paresthesia L > R, posterior headaches, altered cervical and scapular posture, poor B scapular mechanics, B scapular weakness, B cervical paraspinal muscle tension L > R, R rhomboid and upper trap muscle tension, movement preference around the C2/C3 and C6/C7 areas, and difficulty sleeping, as well as laying on his R or L sides secondary to pain. Pt will benefit from skilled physical therapy services to address the aforementioned deficits.    Personal Factors and Comorbidities Age;Comorbidity 3+;Fitness;Past/Current Experience;Time  since onset of injury/illness/exacerbation    Comorbidities Arthritis, DM, HTN, dyspnea, hx of back surgery, B THA    Examination-Activity Limitations Bed Mobility;Sleep    Stability/Clinical Decision Making Stable/Uncomplicated    Clinical Decision Making Low    Rehab Potential Fair    PT Frequency 2x / week    PT Duration 8 weeks    PT Treatment/Interventions Therapeutic exercise;Therapeutic activities;Manual techniques;Electrical Stimulation;Iontophoresis 4mg /ml Dexamethasone;Traction;Neuromuscular re-education;Patient/family education;Dry needling;Vestibular;Canalith Repostioning;Spinal Manipulations;Joint Manipulations   traction and/or manipulation if appropriate   PT Next Visit Plan posture, thoracic extension, scapular strengthening, decrease muscle tension, manual techniques, modalities PRN    Consulted and Agree with Plan of Care Patient             Patient will benefit from skilled therapeutic intervention in order to improve the following deficits and impairments:  Pain, Postural dysfunction, Improper body mechanics, Decreased  strength, Decreased range of motion  Visit Diagnosis: Cervicalgia - Plan: PT plan of care cert/re-cert  Radiculopathy, cervical region - Plan: PT plan of care cert/re-cert     Problem List Patient Active Problem List   Diagnosis Date Noted   S/P left THA, 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, [address], [place] Phone: [phone]   Fax:  [phone]  Name: LOYAL HOLZHEIMER MRN: [name] Date of Birth: [birth year]

## 2021-10-12 ENCOUNTER — Ambulatory Visit: Payer: Medicare Other

## 2021-10-12 DIAGNOSIS — M5412 Radiculopathy, cervical region: Secondary | ICD-10-CM

## 2021-10-12 DIAGNOSIS — M542 Cervicalgia: Secondary | ICD-10-CM | POA: Diagnosis not present

## 2021-10-12 NOTE — Therapy (Signed)
OUTPATIENT PHYSICAL THERAPY TREATMENT NOTE   Patient Name: [name] [name] MRN: [id number] DOB:1950-06-07, 71 y.o., male Today's Date: 10/12/2021  PCP: Alan Mulder, MD REFERRING PROVIDER: Alan Mulder, MD   PT End of Session - 10/12/21 1017     Visit Number 2    Number of Visits 17    Date for PT Re-Evaluation 12/03/21    PT Start Time 1017    PT Stop Time 1101    PT Time Calculation (min) 44 min    Activity Tolerance Patient tolerated treatment well    Behavior During Therapy [facility] for tasks assessed/performed             Past Medical History:  Diagnosis Date   Arthritis    Diabetes mellitus without complication (HCC)    type 2   Dyspnea    with excertion   Headache    rare   Hypertension    Pneumonia    as a child   Past Surgical History:  Procedure Laterality Date   BACK SURGERY     2005   cartarized     bleeding ulcer  2015   JOINT REPLACEMENT     Right total hip 5/29 2018   REPAIR QUADRICEPS / HAMSTRING MUSCLE     torn loose from knee 2009 5/25   TOTAL HIP ARTHROPLASTY Right 07/13/2016   Procedure: RIGHT TOTAL HIP ARTHROPLASTY ANTERIOR APPROACH;  Surgeon: Durene Romans, MD;  Location: [facility];  Service: Orthopedics;  Laterality: Right;   TOTAL HIP ARTHROPLASTY Left 11/30/2016   Procedure: LEFT TOTAL HIP ARTHROPLASTY ANTERIOR APPROACH;  Surgeon: Durene Romans, MD;  Location: [facility];  Service: Orthopedics;  Laterality: Left;  70 mins   Patient Active Problem List   Diagnosis Date Noted   S/P left THA, 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 Feels B UE paresthesia L > R. Keeps him awake at night. Pain began about a couple of years ago, gradual onset. Was laying on his R side a couple of months ago and could not pick up his  head. Has not happened since. Pt sleeps on his L side. Sleeping with his L arm to the side makes it numb. Only imaging is an x-ray. Gets pain in B upper trap pain for the past couple of months and also has posterior headaches. Had PT for low back before and L Achilles tendon tear which have improved. Also has B THA. L hand gets numb to the point where he cannot grip. Happens to R hand as well but not as often. Pt is mainly a side sleeper. Sleeps on the back at times. Pt is R hand dominant. Used to sleep on his L side more but not now. Pt sometimes feels off balance when he wakes up in the morning. 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  PRECAUTIONS: No known precautions  SUBJECTIVE: Neck is kind of stiff like when you wake up with a neck cramp. No L hand numbness currently.   PAIN:  Are you having pain? Neck is stiff.      TODAY'S TREATMENT:    Manual therapy  Seated STM B cervical paraspinal and upper trap muscles to  decrease tension.  Supine STM B cervical paraspinal muscles (upper) to decrease tension    Therapeutic exercise Supine cervical nod 10x5 seconds for 3 sets  Supine B scapular retraction 10x5 seconds  Supine open books 10x5 seconds for 2 sets  Supine chin tucks 10x5 seconds   Supine cervical rotation R and L 10x3 each direction with PT assist and chin tuck position      Improved exercise technique, movement at target joints, use of target muscles after mod verbal, visual, tactile cues.     Response to treatment Pt tolerated session well without aggravation of symptoms.        Clinical impression   Worked on decreasing posterior cervical paraspinal, and B upper trap muscle tension as well as improving thoracic extension and anterior cervical muscle activation to decrease stress to his neck. Pt tolerated session well without aggravation  of symptoms. Pt will benefit from continued skilled physical therapy services to decrease pain, improve strength and function.     PATIENT EDUCATION: Education details: ther-ex, HEP Person educated: Patient Education method: Explanation, Demonstration, Tactile cues, Verbal cues, and Handouts Education comprehension: verbalized understanding and returned demonstration   HOME EXERCISE PROGRAM: Access Code: [email or web address] URL: [email or web address] Date: 10/12/2021 Prepared by: Loralyn Freshwater  Exercises - Supine Deep Neck Flexor Nods  - 1 x daily - 7 x weekly - 3 sets - 10 reps - 5 seconds hold - Supine Shoulder Horizontal Abduction with Dumbbells  - 1 x daily - 7 x weekly - 3 sets - 10 reps - 5seconds hold     PT Short Term Goals - 10/08/21 0957       PT SHORT TERM GOAL #1   Title Pt will be independent with his initial HEP to decrease pain, improve strength, function, and ability to sleep.    Baseline Pt has not yet started his HEP (10/08/2021)    Time 3    Period Weeks    Status New    Target Date 10/29/21              PT Long Term Goals - 10/08/21 0958       PT LONG TERM GOAL #1   Title Pt will have a decrease in neck pain to 2/10 or less at worst to promote ability to sleep/have a full night's rest.    Baseline 7/10 neck pain at worst for the past 3 months (10/08/2021)    Time 8    Period Weeks    Status New    Target Date 12/03/21      PT LONG TERM GOAL #2   Title Pt will report no L hand numbness to promote ability to sleep more comfortably.    Baseline 7/10 L hand numbness at most for the past 3 months (10/08/2021)    Time 8    Period Weeks    Status New    Target Date 12/03/21      PT LONG TERM GOAL #3   Title Pt will improve bilateral lower trap strength to decrease upper trap tension on neck and promote ability to sleep and perform UE tasks more comfortably.    Baseline Seated manually resisted scapular retraction isometrics targeting lower  trap: R 4-/5, L 4/5 secondary to pt unable to be on prone position (10/08/2021)    Time 8    Period Weeks    Status New    Target Date 12/03/21      PT LONG TERM GOAL #4   Title Pt  will improve his neck FOTO score by at least 10 points as a demonstration of improved function.    Baseline neck FOTO 72 (10/08/2021)    Time 8    Period Weeks    Status New    Target Date 12/03/21              Plan - 10/12/21 1211     Clinical Impression Statement Worked on decreasing posterior cervical paraspinal, and B upper trap muscle tension as well as improving thoracic extension and anterior cervical muscle activation to decrease stress to his neck. Pt tolerated session well without aggravation of symptoms. Pt will benefit from continued skilled physical therapy services to decrease pain, improve strength and function.    Personal Factors and Comorbidities Age;Comorbidity 3+;Fitness;Past/Current Experience;Time since onset of injury/illness/exacerbation    Comorbidities Arthritis, DM, HTN, dyspnea, hx of back surgery, B THA    Examination-Activity Limitations Bed Mobility;Sleep    Stability/Clinical Decision Making Stable/Uncomplicated    Clinical Decision Making Low    Rehab Potential Fair    PT Frequency 2x / week    PT Duration 8 weeks    PT Treatment/Interventions Therapeutic exercise;Therapeutic activities;Manual techniques;Electrical Stimulation;Iontophoresis 4mg /ml Dexamethasone;Traction;Neuromuscular re-education;Patient/family education;Dry needling;Vestibular;Canalith Repostioning;Spinal Manipulations;Joint Manipulations   traction and/or manipulation if appropriate   PT Next Visit Plan posture, thoracic extension, scapular strengthening, decrease muscle tension, manual techniques, modalities PRN    PT Home Exercise Plan Medbridge Access Code: [email or web address]    Consulted and Agree with Plan of Care Patient              PT, DPT   10/12/2021, 12:12 PM

## 2021-10-14 ENCOUNTER — Ambulatory Visit: Payer: Medicare Other

## 2021-10-14 DIAGNOSIS — M542 Cervicalgia: Secondary | ICD-10-CM

## 2021-10-14 DIAGNOSIS — M5412 Radiculopathy, cervical region: Secondary | ICD-10-CM

## 2021-10-14 NOTE — Therapy (Signed)
OUTPATIENT PHYSICAL THERAPY TREATMENT NOTE   Patient Name: [name] [name] MRN: [id number] DOB:[birth year], 71 y.o., male Today's Date: 10/14/2021  PCP: Alan Mulder, MD REFERRING PROVIDER: Alan Mulder, MD   PT End of Session - 10/14/21 1622     Visit Number 3    Number of Visits 17    Date for PT Re-Evaluation 12/03/21    PT Start Time 1625    PT Stop Time 1712    PT Time Calculation (min) 47 min    Activity Tolerance Patient tolerated treatment well    Behavior During Therapy [facility] for tasks assessed/performed              Past Medical History:  Diagnosis Date   Arthritis    Diabetes mellitus without complication (HCC)    type 2   Dyspnea    with excertion   Headache    rare   Hypertension    Pneumonia    as a child   Past Surgical History:  Procedure Laterality Date   BACK SURGERY     2005   cartarized     bleeding ulcer  2015   JOINT REPLACEMENT     Right total hip 5/29 2018   REPAIR QUADRICEPS / HAMSTRING MUSCLE     torn loose from knee 2009 5/25   TOTAL HIP ARTHROPLASTY Right 07/13/2016   Procedure: RIGHT TOTAL HIP ARTHROPLASTY ANTERIOR APPROACH;  Surgeon: Durene Romans, MD;  Location: [facility];  Service: Orthopedics;  Laterality: Right;   TOTAL HIP ARTHROPLASTY Left 11/30/2016   Procedure: LEFT TOTAL HIP ARTHROPLASTY ANTERIOR APPROACH;  Surgeon: Durene Romans, MD;  Location: [facility];  Service: Orthopedics;  Laterality: Left;  70 mins   Patient Active Problem List   Diagnosis Date Noted   S/P left THA, 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 Feels B UE paresthesia L > R. Keeps him awake at night. Pain began about a couple of years ago, gradual onset. Was laying on his R side a couple of months ago and could not pick up his  head. Has not happened since. Pt sleeps on his L side. Sleeping with his L arm to the side makes it numb. Only imaging is an x-ray. Gets pain in B upper trap pain for the past couple of months and also has posterior headaches. Had PT for low back before and L Achilles tendon tear which have improved. Also has B THA. L hand gets numb to the point where he cannot grip. Happens to R hand as well but not as often. Pt is mainly a side sleeper. Sleeps on the back at times. Pt is R hand dominant. Used to sleep on his L side more but not now. Pt sometimes feels off balance when he wakes up in the morning. 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  PRECAUTIONS: No known precautions  SUBJECTIVE: Neck is a little sore. 2-3/10 currently, just lets him know its there.   PAIN:  Are you having pain? See subjective     TODAY'S TREATMENT:    Manual therapy  Supine R UPA to C2, C3, C4, C5 TP grade 3 - to promote mobility  Supine  MWM (mobilization with movement) L cervical rotation with R UPA to C3, C4, C5 TP 10x each level  Seated caudal glide L and R first rib grade 3 to decrease stiffness  Seated STM R  cervical paraspinal and upper trap muscles to decrease tension.        Therapeutic exercise  Supine VBI testing (-) bilaterally   Supine cervical nod 10x5 seconds  Supine L cervical side bend 10 seconds x 5 for 2 sets.   Decreased L lateral neck tension   Supine B scapular retraction 10x5 seconds for 2 sets   With thoracic towel roll:  Supine open books 10x5 seconds for 2 sets to promote thoracic extension     Supine B shoulder flexion 10x2 with 5 second holds to promote upper thoracic extension       Improved exercise technique, movement at target joints, use of target muscles after mod verbal, visual, tactile cues.     Response to treatment Pt tolerated  session well without aggravation of symptoms. Pt states neck feels good after session. No pain reported.        Clinical impression   Worked on decreasing posterior cervical paraspinal, and B upper trap muscle tension as well as improving thoracic extension and cervical mobility to decrease stress to his neck. Pt tolerated session well without aggravation of symptoms. Pt states neck feels good after session. No pain reported.  Pt will benefit from continued skilled physical therapy services to decrease pain, improve strength and function.          PATIENT EDUCATION: Education details: ther-ex, HEP Person educated: Patient Education method: Explanation, Demonstration, Tactile cues, Verbal cues, and Handouts Education comprehension: verbalized understanding and returned demonstration   HOME EXERCISE PROGRAM: Access Code: [email or web address] URL: [email or web address] Date: 10/12/2021 Prepared by: Loralyn Freshwater  Exercises - Supine Deep Neck Flexor Nods  - 1 x daily - 7 x weekly - 3 sets - 10 reps - 5 seconds hold - Supine Shoulder Horizontal Abduction with Dumbbells  - 1 x daily - 7 x weekly - 3 sets - 10 reps - 5seconds hold     PT Short Term Goals - 10/08/21 0957       PT SHORT TERM GOAL #1   Title Pt will be independent with his initial HEP to decrease pain, improve strength, function, and ability to sleep.    Baseline Pt has not yet started his HEP (10/08/2021)    Time 3    Period Weeks    Status New    Target Date 10/29/21              PT Long Term Goals - 10/08/21 0958       PT LONG TERM GOAL #1   Title Pt will have a decrease in neck pain to 2/10 or less at worst to promote ability to sleep/have a full night's rest.    Baseline 7/10 neck pain at worst for the past 3 months (10/08/2021)    Time 8    Period Weeks    Status New    Target Date 12/03/21      PT LONG TERM GOAL #2   Title Pt will report no L hand numbness to promote ability to sleep more  comfortably.    Baseline 7/10 L hand numbness at most for the past 3 months (10/08/2021)    Time 8    Period Weeks    Status New    Target Date 12/03/21  PT LONG TERM GOAL #3   Title Pt will improve bilateral lower trap strength to decrease upper trap tension on neck and promote ability to sleep and perform UE tasks more comfortably.    Baseline Seated manually resisted scapular retraction isometrics targeting lower trap: R 4-/5, L 4/5 secondary to pt unable to be on prone position (10/08/2021)    Time 8    Period Weeks    Status New    Target Date 12/03/21      PT LONG TERM GOAL #4   Title Pt will improve his neck FOTO score by at least 10 points as a demonstration of improved function.    Baseline neck FOTO 72 (10/08/2021)    Time 8    Period Weeks    Status New    Target Date 12/03/21              Plan - 10/14/21 1622     Clinical Impression Statement Worked on decreasing posterior cervical paraspinal, and B upper trap muscle tension as well as improving thoracic extension and cervical mobility to decrease stress to his neck. Pt tolerated session well without aggravation of symptoms. Pt states neck feels good after session. No pain reported.  Pt will benefit from continued skilled physical therapy services to decrease pain, improve strength and function.    Personal Factors and Comorbidities Age;Comorbidity 3+;Fitness;Past/Current Experience;Time since onset of injury/illness/exacerbation    Comorbidities Arthritis, DM, HTN, dyspnea, hx of back surgery, B THA    Examination-Activity Limitations Bed Mobility;Sleep    Stability/Clinical Decision Making Stable/Uncomplicated    Clinical Decision Making Low    Rehab Potential Fair    PT Frequency 2x / week    PT Duration 8 weeks    PT Treatment/Interventions Therapeutic exercise;Therapeutic activities;Manual techniques;Electrical Stimulation;Iontophoresis 4mg /ml Dexamethasone;Traction;Neuromuscular re-education;Patient/family  education;Dry needling;Vestibular;Canalith Repostioning;Spinal Manipulations;Joint Manipulations   traction and/or manipulation if appropriate   PT Next Visit Plan posture, thoracic extension, scapular strengthening, decrease muscle tension, manual techniques, modalities PRN    PT Home Exercise Plan Medbridge Access Code: [email or web address]    Consulted and Agree with Plan of Care Patient               Joneen Boers PT, DPT   10/14/2021, 6:39 PM

## 2021-10-26 ENCOUNTER — Ambulatory Visit: Payer: Medicare Other | Attending: Family Medicine

## 2021-10-26 DIAGNOSIS — M5412 Radiculopathy, cervical region: Secondary | ICD-10-CM | POA: Insufficient documentation

## 2021-10-26 DIAGNOSIS — M542 Cervicalgia: Secondary | ICD-10-CM | POA: Insufficient documentation

## 2021-10-26 NOTE — Therapy (Signed)
OUTPATIENT PHYSICAL THERAPY TREATMENT NOTE   Patient Name: [name] [name] MRN: [id number] DOB:03/16/1950, 71 y.o., male Today's Date: 10/26/2021  PCP: Alan Mulder, MD REFERRING PROVIDER: Alan Mulder, MD   PT End of Session - 10/26/21 0802     Visit Number 4    Number of Visits 17    Date for PT Re-Evaluation 12/03/21    PT Start Time 0803    PT Stop Time 0845    PT Time Calculation (min) 42 min    Activity Tolerance Patient tolerated treatment well    Behavior During Therapy [facility] for tasks assessed/performed               Past Medical History:  Diagnosis Date   Arthritis    Diabetes mellitus without complication (HCC)    type 2   Dyspnea    with excertion   Headache    rare   Hypertension    Pneumonia    as a child   Past Surgical History:  Procedure Laterality Date   BACK SURGERY     2005   cartarized     bleeding ulcer  2015   JOINT REPLACEMENT     Right total hip 5/29 2018   REPAIR QUADRICEPS / HAMSTRING MUSCLE     torn loose from knee 2009 5/25   TOTAL HIP ARTHROPLASTY Right 07/13/2016   Procedure: RIGHT TOTAL HIP ARTHROPLASTY ANTERIOR APPROACH;  Surgeon: Durene Romans, MD;  Location: [facility];  Service: Orthopedics;  Laterality: Right;   TOTAL HIP ARTHROPLASTY Left 11/30/2016   Procedure: LEFT TOTAL HIP ARTHROPLASTY ANTERIOR APPROACH;  Surgeon: Durene Romans, MD;  Location: [facility];  Service: Orthopedics;  Laterality: Left;  70 mins   Patient Active Problem List   Diagnosis Date Noted   S/P left THA, 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 Feels B UE paresthesia L > R. Keeps him awake at night. Pain began about a couple of years ago, gradual onset. Was laying on his R side a couple of months ago and could not pick up his  head. Has not happened since. Pt sleeps on his L side. Sleeping with his L arm to the side makes it numb. Only imaging is an x-ray. Gets pain in B upper trap pain for the past couple of months and also has posterior headaches. Had PT for low back before and L Achilles tendon tear which have improved. Also has B THA. L hand gets numb to the point where he cannot grip. Happens to R hand as well but not as often. Pt is mainly a side sleeper. Sleeps on the back at times. Pt is R hand dominant. Used to sleep on his L side more but not now. Pt sometimes feels off balance when he wakes up in the morning. 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  PRECAUTIONS: No known precautions  SUBJECTIVE: Neck feels like he has a cramp in it, felt like that for the past few days. L upper trap 2/10 currently. Neck is still a little stiff. Has been doing his HEP 2x/day  PAIN:  Are you having pain? See subjective     TODAY'S TREATMENT:  Manual therapy  Seated STM L and R cervical paraspinal and L upper trap muscles to decrease tension.   Seated L to R pressure to C6 spinous process with L cervical rotation 10x then C7 spinous process 10x2  (To decrease R rotation position)  No L upper trap cramping afterwards.      Therapeutic exercise  Seated manually resisted scapular retraction targeting lower trap   R 10x3 with 5 second holds   L 10x3 with 5 second holds   Decreased L upper trap cramp sensation afterwards   Standing shoulder extension with scapular retraction Blue band  L 10x5 seconds for 3 sets  Doorway pectoralis stretch   R 30 seconds x 3   L 30 seconds x 3     Improved exercise technique, movement at target joints, use of target muscles after mod verbal, visual, tactile cues.     Response to treatment Pt tolerated session well without aggravation of symptoms.   No L upper trap cramp sensation reported after treatment.       Clinical impression   Worked on decreasing R rotation of C7, improving pectoralis flexibility, middle and lower trap strength to decrease L upper trap muscle tension and decrease pressure L UE peripheral nerves. No L upper trap cramp sensation reported after treatment.  Pt will benefit from continued skilled physical therapy services to decrease pain, improve strength and function.          PATIENT EDUCATION: Education details: ther-ex, HEP Person educated: Patient Education method: Explanation, Demonstration, Tactile cues, Verbal cues, and Handouts Education comprehension: verbalized understanding and returned demonstration   HOME EXERCISE PROGRAM: Access Code: [email or web address] URL: [email or web address] Date: 10/12/2021 Prepared by: Loralyn Freshwater  Exercises - Supine Deep Neck Flexor Nods  - 1 x daily - 7 x weekly - 3 sets - 10 reps - 5 seconds hold - Supine Shoulder Horizontal Abduction with Dumbbells  - 1 x daily - 7 x weekly - 3 sets - 10 reps - 5seconds hold  - Single Arm Shoulder Extension with Anchored Resistance  - 1 x daily - 7 x weekly - 3 sets - 10 reps - 5 seconds hold  - Single Arm Doorway Pec Stretch at 90 Degrees Abduction  - 3 x daily - 7 x weekly - 1 sets - 3 reps - 30 seconds hold   PT Short Term Goals - 10/08/21 0957       PT SHORT TERM GOAL #1   Title Pt will be independent with his initial HEP to decrease pain, improve strength, function, and ability to sleep.    Baseline Pt has not yet started his HEP (10/08/2021)    Time 3    Period Weeks    Status New    Target Date 10/29/21              PT Long Term Goals - 10/08/21 0958       PT LONG TERM GOAL #1   Title Pt will have a decrease in neck pain to 2/10 or less at worst to promote ability to sleep/have a full night's rest.    Baseline 7/10 neck pain at worst for the past 3 months (10/08/2021)    Time 8    Period Weeks     Status New    Target Date 12/03/21      PT LONG TERM GOAL #2   Title Pt will report no L hand numbness to promote ability to sleep more comfortably.    Baseline  7/10 L hand numbness at most for the past 3 months (10/08/2021)    Time 8    Period Weeks    Status New    Target Date 12/03/21      PT LONG TERM GOAL #3   Title Pt will improve bilateral lower trap strength to decrease upper trap tension on neck and promote ability to sleep and perform UE tasks more comfortably.    Baseline Seated manually resisted scapular retraction isometrics targeting lower trap: R 4-/5, L 4/5 secondary to pt unable to be on prone position (10/08/2021)    Time 8    Period Weeks    Status New    Target Date 12/03/21      PT LONG TERM GOAL #4   Title Pt will improve his neck FOTO score by at least 10 points as a demonstration of improved function.    Baseline neck FOTO 72 (10/08/2021)    Time 8    Period Weeks    Status New    Target Date 12/03/21              Plan - 10/26/21 0801     Clinical Impression Statement Worked on decreasing R rotation of C7, improving pectoralis flexibility, middle and lower trap strength to decrease L upper trap muscle tension and decrease pressure L UE peripheral nerves. No L upper trap cramp sensation reported after treatment.  Pt will benefit from continued skilled physical therapy services to decrease pain, improve strength and function.    Personal Factors and Comorbidities Age;Comorbidity 3+;Fitness;Past/Current Experience;Time since onset of injury/illness/exacerbation    Comorbidities Arthritis, DM, HTN, dyspnea, hx of back surgery, B THA    Examination-Activity Limitations Bed Mobility;Sleep    Stability/Clinical Decision Making Stable/Uncomplicated    Rehab Potential Fair    PT Frequency 2x / week    PT Duration 8 weeks    PT Treatment/Interventions Therapeutic exercise;Therapeutic activities;Manual techniques;Electrical Stimulation;Iontophoresis 4mg /ml  Dexamethasone;Traction;Neuromuscular re-education;Patient/family education;Dry needling;Vestibular;Canalith Repostioning;Spinal Manipulations;Joint Manipulations   traction and/or manipulation if appropriate   PT Next Visit Plan posture, thoracic extension, scapular strengthening, decrease muscle tension, manual techniques, modalities PRN    PT Home Exercise Plan Med[employer] Access Code: [email or web address]    Consulted and Agree with Plan of Care Patient                PT, DPT   10/26/2021, 9:01 AM

## 2021-10-28 ENCOUNTER — Ambulatory Visit: Payer: Medicare Other

## 2021-11-02 ENCOUNTER — Ambulatory Visit: Payer: Medicare Other

## 2021-11-02 DIAGNOSIS — M5412 Radiculopathy, cervical region: Secondary | ICD-10-CM

## 2021-11-02 DIAGNOSIS — M542 Cervicalgia: Secondary | ICD-10-CM

## 2021-11-02 NOTE — Therapy (Signed)
OUTPATIENT PHYSICAL THERAPY TREATMENT NOTE   Patient Name: [name] [name] MRN: [id number] DOB:1950-04-23, 71 y.o., male Today's Date: 11/02/2021  PCP: Lenard Simmer, MD REFERRING PROVIDER: Lenard Simmer, MD   PT End of Session - 11/02/21 0847     Visit Number 5    Number of Visits 17    Date for PT Re-Evaluation 12/03/21    PT Start Time 0867    PT Stop Time 0929    PT Time Calculation (min) 42 min    Activity Tolerance Patient tolerated treatment well    Behavior During Therapy [facility] for tasks assessed/performed                Past Medical History:  Diagnosis Date   Arthritis    Diabetes mellitus without complication ([place])    type 2   Dyspnea    with excertion   Headache    rare   Hypertension    Pneumonia    as a child   Past Surgical History:  Procedure Laterality Date   BACK SURGERY     2005   cartarized     bleeding ulcer  2015   JOINT REPLACEMENT     Right total hip 5/29 2018   REPAIR QUADRICEPS / HAMSTRING MUSCLE     torn loose from knee 2009 5/25   TOTAL HIP ARTHROPLASTY Right 07/13/2016   Procedure: RIGHT TOTAL HIP ARTHROPLASTY ANTERIOR APPROACH;  Surgeon: Paralee Cancel, MD;  Location: [facility];  Service: Orthopedics;  Laterality: Right;   TOTAL HIP ARTHROPLASTY Left 11/30/2016   Procedure: LEFT TOTAL HIP ARTHROPLASTY ANTERIOR APPROACH;  Surgeon: Paralee Cancel, MD;  Location: [facility];  Service: Orthopedics;  Laterality: Left;  70 mins   Patient Active Problem List   Diagnosis Date Noted   S/P left THA, 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 Feels B UE paresthesia L > R. Keeps him awake at night. Pain began about a couple of years ago, gradual onset. Was laying on his R side a couple of months ago and could not pick up  his head. Has not happened since. Pt sleeps on his L side. Sleeping with his L arm to the side makes it numb. Only imaging is an x-ray. Gets pain in B upper trap pain for the past couple of months and also has posterior headaches. Had PT for low back before and L Achilles tendon tear which have improved. Also has B THA. L hand gets numb to the point where he cannot grip. Happens to R hand as well but not as often. Pt is mainly a side sleeper. Sleeps on the back at times. Pt is R hand dominant. Used to sleep on his L side more but not now. Pt sometimes feels off balance when he wakes up in the morning. 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  PRECAUTIONS: No known precautions  SUBJECTIVE: Neck feels stiff. No pian currently.   PAIN:  Are you having pain? See subjective     TODAY'S TREATMENT:    Manual therapy   Seated STM L and R cervical paraspinal and L and R upper trap muscles to decrease tension.   Seated L  to R pressure to C7 then C6 spinous process with L cervical rotation 10x2    Seated STM R pectoralis muscle to decrase tension      Therapeutic exercise  Seated B scapular retraction 10x5 seconds to promote thoracic extension  Seated chin tucks 10x5 seconds   OMEGA rows plate 20 for 56L8 seconds  Then plate 35 for 93T3 seconds for 3 sets  Supine deep cervical flexion 10x2       Improved exercise technique, movement at target joints, use of target muscles after mod verbal, visual, tactile cues.     Response to treatment Pt tolerated session well without aggravation of symptoms.   .       Clinical impression   Worked on decreasing B cervical paraspinal and upper trap and R pectoralis muscle tension to decrease stress to his neck. Also worked on scapular strengthening and upper thoracic extension to decrease lower cervical extension  pressure. Pt tolerated session well without aggravation of symptoms. Pt will benefit from continued skilled physical therapy services to decrease pain, improve strength and function.          PATIENT EDUCATION: Education details: ther-ex, HEP Person educated: Patient Education method: Explanation, Demonstration, Tactile cues, Verbal cues, and Handouts Education comprehension: verbalized understanding and returned demonstration   HOME EXERCISE PROGRAM: Access Code: [email or web address] URL: [email or web address] Date: 10/12/2021 Prepared by: Loralyn Freshwater  Exercises - Supine Deep Neck Flexor Nods  - 1 x daily - 7 x weekly - 3 sets - 10 reps - 5 seconds hold - Supine Shoulder Horizontal Abduction with Dumbbells  - 1 x daily - 7 x weekly - 3 sets - 10 reps - 5seconds hold  - Single Arm Shoulder Extension with Anchored Resistance  - 1 x daily - 7 x weekly - 3 sets - 10 reps - 5 seconds hold  - Single Arm Doorway Pec Stretch at 90 Degrees Abduction  - 3 x daily - 7 x weekly - 1 sets - 3 reps - 30 seconds hold   PT Short Term Goals - 11/02/21 0849       PT SHORT TERM GOAL #1   Title Pt will be independent with his initial HEP to decrease pain, improve strength, function, and ability to sleep.    Baseline Pt has not yet started his HEP (10/08/2021); able to do his HEP, no questions (11/02/2021)    Time 3    Period Weeks    Status Achieved    Target Date 10/29/21              PT Long Term Goals - 10/08/21 0958       PT LONG TERM GOAL #1   Title Pt will have a decrease in neck pain to 2/10 or less at worst to promote ability to sleep/have a full night's rest.    Baseline 7/10 neck pain at worst for the past 3 months (10/08/2021)    Time 8    Period Weeks    Status New    Target Date 12/03/21      PT LONG TERM GOAL #2   Title Pt will report no L hand numbness to promote ability to sleep more comfortably.    Baseline 7/10 L hand numbness at most for the past 3 months  (10/08/2021)    Time 8    Period Weeks    Status New    Target Date 12/03/21      PT LONG TERM GOAL #3   Title Pt  will improve bilateral lower trap strength to decrease upper trap tension on neck and promote ability to sleep and perform UE tasks more comfortably.    Baseline Seated manually resisted scapular retraction isometrics targeting lower trap: R 4-/5, L 4/5 secondary to pt unable to be on prone position (10/08/2021)    Time 8    Period Weeks    Status New    Target Date 12/03/21      PT LONG TERM GOAL #4   Title Pt will improve his neck FOTO score by at least 10 points as a demonstration of improved function.    Baseline neck FOTO 72 (10/08/2021)    Time 8    Period Weeks    Status New    Target Date 12/03/21              Plan - 11/02/21 0847     Clinical Impression Statement Worked on decreasing B cervical paraspinal and upper trap and R pectoralis muscle tension to decrease stress to his neck. Also worked on scapular strengthening and upper thoracic extension to decrease lower cervical extension pressure. Pt tolerated session well without aggravation of symptoms. Pt will benefit from continued skilled physical therapy services to decrease pain, improve strength and function.    Personal Factors and Comorbidities Age;Comorbidity 3+;Fitness;Past/Current Experience;Time since onset of injury/illness/exacerbation    Comorbidities Arthritis, DM, HTN, dyspnea, hx of back surgery, B THA    Examination-Activity Limitations Bed Mobility;Sleep    Stability/Clinical Decision Making Stable/Uncomplicated    Clinical Decision Making Low    Rehab Potential Fair    PT Frequency 2x / week    PT Duration 8 weeks    PT Treatment/Interventions Therapeutic exercise;Therapeutic activities;Manual techniques;Electrical Stimulation;Iontophoresis 4mg /ml Dexamethasone;Traction;Neuromuscular re-education;Patient/family education;Dry needling;Vestibular;Canalith Repostioning;Spinal  Manipulations;Joint Manipulations   traction and/or manipulation if appropriate   PT Next Visit Plan posture, thoracic extension, scapular strengthening, decrease muscle tension, manual techniques, modalities PRN    PT Home Exercise Plan Medbridge Access Code: [email or web address]    Consulted and Agree with Plan of Care Patient                 PT, DPT   11/02/2021, 1:19 PM

## 2021-11-04 ENCOUNTER — Ambulatory Visit: Payer: Medicare Other

## 2021-11-04 DIAGNOSIS — M5412 Radiculopathy, cervical region: Secondary | ICD-10-CM

## 2021-11-04 DIAGNOSIS — M542 Cervicalgia: Secondary | ICD-10-CM | POA: Diagnosis not present

## 2021-11-04 NOTE — Therapy (Signed)
OUTPATIENT PHYSICAL THERAPY TREATMENT NOTE   Patient Name: [name] [name] MRN: [id number] DOB:04-02-1950, 71 y.o., male Today's Date: 11/04/2021  PCP: Lenard Simmer, MD REFERRING PROVIDER: Lenard Simmer, MD   PT End of Session - 11/04/21 0849     Visit Number 6    Number of Visits 17    Date for PT Re-Evaluation 12/03/21    PT Start Time 0849    PT Stop Time 0931    PT Time Calculation (min) 42 min    Activity Tolerance Patient tolerated treatment well    Behavior During Therapy [facility] for tasks assessed/performed                 Past Medical History:  Diagnosis Date   Arthritis    Diabetes mellitus without complication ([place])    type 2   Dyspnea    with excertion   Headache    rare   Hypertension    Pneumonia    as a child   Past Surgical History:  Procedure Laterality Date   BACK SURGERY     2005   cartarized     bleeding ulcer  2015   JOINT REPLACEMENT     Right total hip 5/29 2018   REPAIR QUADRICEPS / HAMSTRING MUSCLE     torn loose from knee 2009 5/25   TOTAL HIP ARTHROPLASTY Right 07/13/2016   Procedure: RIGHT TOTAL HIP ARTHROPLASTY ANTERIOR APPROACH;  Surgeon: Paralee Cancel, MD;  Location: [facility];  Service: Orthopedics;  Laterality: Right;   TOTAL HIP ARTHROPLASTY Left 11/30/2016   Procedure: LEFT TOTAL HIP ARTHROPLASTY ANTERIOR APPROACH;  Surgeon: Paralee Cancel, MD;  Location: [facility];  Service: Orthopedics;  Laterality: Left;  70 mins   Patient Active Problem List   Diagnosis Date Noted   S/P left THA, 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 Feels B UE paresthesia L > R. Keeps him awake at night. Pain began about a couple of years ago, gradual onset. Was laying on his R side a couple of months ago and could not pick up  his head. Has not happened since. Pt sleeps on his L side. Sleeping with his L arm to the side makes it numb. Only imaging is an x-ray. Gets pain in B upper trap pain for the past couple of months and also has posterior headaches. Had PT for low back before and L Achilles tendon tear which have improved. Also has B THA. L hand gets numb to the point where he cannot grip. Happens to R hand as well but not as often. Pt is mainly a side sleeper. Sleeps on the back at times. Pt is R hand dominant. Used to sleep on his L side more but not now. Pt sometimes feels off balance when he wakes up in the morning. 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  PRECAUTIONS: No known precautions  SUBJECTIVE: Neck is feeling fine. L arm to hand (ulnar nerve pathway) feels a little numb. R UE is fine.     PAIN:  Are you having pain? See subjective     TODAY'S TREATMENT:      Therapeutic exercise  Standing L first rib  stretch with strap 30 seconds x 5  Slight decreased L UE paresthesia  Seated thoracic extension at chair 10 x 5 seconds for 3 sets  Standing B shoulder extension with B scapular retraction blue band 10x3 with 5 second holds to promote upper thoracic extension   Seated levator stretch   L 30 seconds x 3   Lights on and off during first stretch but not the following 2 stretches. PT was informed after all 3 repetitions were finished.    Pt was recommended to contact his MD if he senses the lights flickering again. Pt verbalized understanding.    Improved exercise technique, movement at target joints, use of target muscles after mod verbal, visual, tactile cues.      Manual therapy  Seated STM L and R cervical paraspinal and L and R upper trap muscles to decrease tension.   Seated STM R pectoralis muscle to decrase tension     Response to treatment Pt tolerated  session well without aggravation of symptoms.  Decreased L UE paresthesia after session and after treatment to decrease L and R upper trap muscle tension.      Clinical impression   Decreased L UE paresthesia after session and after treatment to decrease L and R upper trap muscle tension. Continued working on improving thoracic extension to decrease extension stress to cervical spine. Pt tolerated session well without aggravation of symptoms. Pt will benefit from continued skilled physical therapy services to decrease pain, improve strength and function.          PATIENT EDUCATION: Education details: ther-ex, HEP Person educated: Patient Education method: Explanation, Demonstration, Tactile cues, Verbal cues, and Handouts Education comprehension: verbalized understanding and returned demonstration   HOME EXERCISE PROGRAM: Access Code: [email or web address] URL: [email or web address] Date: 10/12/2021 Prepared by: Loralyn Freshwater  Exercises - Supine Deep Neck Flexor Nods  - 1 x daily - 7 x weekly - 3 sets - 10 reps - 5 seconds hold - Supine Shoulder Horizontal Abduction with Dumbbells  - 1 x daily - 7 x weekly - 3 sets - 10 reps - 5seconds hold  - Single Arm Shoulder Extension with Anchored Resistance  - 1 x daily - 7 x weekly - 3 sets - 10 reps - 5 seconds hold  - Single Arm Doorway Pec Stretch at 90 Degrees Abduction  - 3 x daily - 7 x weekly - 1 sets - 3 reps - 30 seconds hold   PT Short Term Goals - 11/02/21 0849       PT SHORT TERM GOAL #1   Title Pt will be independent with his initial HEP to decrease pain, improve strength, function, and ability to sleep.    Baseline Pt has not yet started his HEP (10/08/2021); able to do his HEP, no questions (11/02/2021)    Time 3    Period Weeks    Status Achieved    Target Date 10/29/21              PT Long Term Goals - 10/08/21 0958       PT LONG TERM GOAL #1   Title Pt will have a decrease in neck pain to 2/10 or less  at worst to promote ability to sleep/have a full night's rest.    Baseline 7/10 neck pain at worst for the past 3 months (10/08/2021)    Time 8    Period Weeks    Status New    Target Date 12/03/21      PT  LONG TERM GOAL #2   Title Pt will report no L hand numbness to promote ability to sleep more comfortably.    Baseline 7/10 L hand numbness at most for the past 3 months (10/08/2021)    Time 8    Period Weeks    Status New    Target Date 12/03/21      PT LONG TERM GOAL #3   Title Pt will improve bilateral lower trap strength to decrease upper trap tension on neck and promote ability to sleep and perform UE tasks more comfortably.    Baseline Seated manually resisted scapular retraction isometrics targeting lower trap: R 4-/5, L 4/5 secondary to pt unable to be on prone position (10/08/2021)    Time 8    Period Weeks    Status New    Target Date 12/03/21      PT LONG TERM GOAL #4   Title Pt will improve his neck FOTO score by at least 10 points as a demonstration of improved function.    Baseline neck FOTO 72 (10/08/2021)    Time 8    Period Weeks    Status New    Target Date 12/03/21              Plan - 11/04/21 0847     Clinical Impression Statement Decreased L UE paresthesia after session and after treatment to decrease L and R upper trap muscle tension. Continued working on improving thoracic extension to decrease extension stress to cervical spine. Pt tolerated session well without aggravation of symptoms. Pt will benefit from continued skilled physical therapy services to decrease pain, improve strength and function.    Personal Factors and Comorbidities Age;Comorbidity 3+;Fitness;Past/Current Experience;Time since onset of injury/illness/exacerbation    Comorbidities Arthritis, DM, HTN, dyspnea, hx of back surgery, B THA    Examination-Activity Limitations Bed Mobility;Sleep    Stability/Clinical Decision Making Stable/Uncomplicated    Rehab Potential Fair    PT  Frequency 2x / week    PT Duration 8 weeks    PT Treatment/Interventions Therapeutic exercise;Therapeutic activities;Manual techniques;Electrical Stimulation;Iontophoresis 4mg /ml Dexamethasone;Traction;Neuromuscular re-education;Patient/family education;Dry needling;Vestibular;Canalith Repostioning;Spinal Manipulations;Joint Manipulations   traction and/or manipulation if appropriate   PT Next Visit Plan posture, thoracic extension, scapular strengthening, decrease muscle tension, manual techniques, modalities PRN    PT Home Exercise Plan Medbridge Access Code: [email or web address]    Consulted and Agree with Plan of Care Patient                  PT, DPT   11/04/2021, 3:50 PM

## 2021-11-09 ENCOUNTER — Ambulatory Visit: Payer: Medicare Other

## 2021-11-09 DIAGNOSIS — M5412 Radiculopathy, cervical region: Secondary | ICD-10-CM

## 2021-11-09 DIAGNOSIS — M542 Cervicalgia: Secondary | ICD-10-CM | POA: Diagnosis not present

## 2021-11-09 NOTE — Therapy (Addendum)
OUTPATIENT PHYSICAL THERAPY TREATMENT NOTE   Patient Name: [name] [name] MRN: [id number] DOB:[birth year], 71 y.o., male Today's Date: 11/09/2021  PCP: Lenard Simmer, MD REFERRING PROVIDER: Lenard Simmer, MD   PT End of Session - 11/09/21 1327     Visit Number 7    Number of Visits 17    Date for PT Re-Evaluation 12/03/21    PT Start Time 0930    PT Stop Time 1015    PT Time Calculation (min) 45 min    Activity Tolerance Patient tolerated treatment well    Behavior During Therapy [facility] for tasks assessed/performed                 Past Medical History:  Diagnosis Date   Arthritis    Diabetes mellitus without complication ([place])    type 2   Dyspnea    with excertion   Headache    rare   Hypertension    Pneumonia    as a child   Past Surgical History:  Procedure Laterality Date   BACK SURGERY     2005   cartarized     bleeding ulcer  2015   JOINT REPLACEMENT     Right total hip 5/29 2018   REPAIR QUADRICEPS / HAMSTRING MUSCLE     torn loose from knee 2009 5/25   TOTAL HIP ARTHROPLASTY Right 07/13/2016   Procedure: RIGHT TOTAL HIP ARTHROPLASTY ANTERIOR APPROACH;  Surgeon: Paralee Cancel, MD;  Location: [facility];  Service: Orthopedics;  Laterality: Right;   TOTAL HIP ARTHROPLASTY Left 11/30/2016   Procedure: LEFT TOTAL HIP ARTHROPLASTY ANTERIOR APPROACH;  Surgeon: Paralee Cancel, MD;  Location: [facility];  Service: Orthopedics;  Laterality: Left;  70 mins   Patient Active Problem List   Diagnosis Date Noted   S/P left THA, 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 Feels B UE paresthesia L > R. Keeps him awake at night. Pain began about a couple of years ago, gradual onset. Was laying on his R side a couple of months ago and could not pick up  his head. Has not happened since. Pt sleeps on his L side. Sleeping with his L arm to the side makes it numb. Only imaging is an x-ray. Gets pain in B upper trap pain for the past couple of months and also has posterior headaches. Had PT for low back before and L Achilles tendon tear which have improved. Also has B THA. L hand gets numb to the point where he cannot grip. Happens to R hand as well but not as often. Pt is mainly a side sleeper. Sleeps on the back at times. Pt is R hand dominant. Used to sleep on his L side more but not now. Pt sometimes feels off balance when he wakes up in the morning. 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  PRECAUTIONS: No known precautions  SUBJECTIVE: Neck is not painful upon arrival.  He continues to note intermittent L UE parasthesias, specifically in finger tips- he states digits 2-5.  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  He tries to take standing breaks outside occasionally too.  PAIN:  Are you having pain? See subjective     TODAY'S TREATMENT:      Therapeutic exercise  Standing L first rib stretch with strap 30 seconds x 5  Slight decreased L UE paresthesia  Seated thoracic extension at chair 10 x 5 seconds for 32sets, added ball between shoulder blades x10  Standing B shoulder extension with B scapular retraction blue band 10x3 with 5 second holds to promote upper thoracic extension   Standing with back at the wall, chin tuck with palm press into wall isometric hold 5 seconds x12  Standing facing wall, chin tuck, arms overhead in "y" with b/l palm lift off wall 5 seconds x 12, with red band around wrist x10  Seated rows OMEGA, level 35 resistance, 2 x12, PT tactile and verbal cues to minimize "shrug" with UT   Improved exercise technique, movement at target joints, use of  target muscles after mod verbal, visual, tactile cues.     Manual therapy  Seated STM L and R cervical paraspinal and L and R upper trap muscles to decrease tension.   Seated STM R pectoralis muscle to decrase tension     Response to treatment Pt tolerated session well without aggravation of symptoms.  Decreased L UE paresthesia after session and after treatment to decrease L and R upper trap muscle tension.      Clinical impression   Pt required tactile/verbal cues for neutral cervical spine positioning and deep neck flexor activation during scapular mm retraining today.  Pt tolerated session well without aggravation of symptoms during scapular mm retraining. Pt will benefit from continued skilled physical therapy services to decrease pain, improve strength and function.      PATIENT EDUCATION: Education details: ther-ex, HEP Person educated: Patient Education method: Explanation, Demonstration, Tactile cues, Verbal cues, and Handouts Education comprehension: verbalized understanding and returned demonstration   HOME EXERCISE PROGRAM: Access Code: [email or web address] URL: [email or web address] Date: 10/12/2021 Prepared by: Loralyn Freshwater  Exercises - Supine Deep Neck Flexor Nods  - 1 x daily - 7 x weekly - 3 sets - 10 reps - 5 seconds hold - Supine Shoulder Horizontal Abduction with Dumbbells  - 1 x daily - 7 x weekly - 3 sets - 10 reps - 5seconds hold  - Single Arm Shoulder Extension with Anchored Resistance  - 1 x daily - 7 x weekly - 3 sets - 10 reps - 5 seconds hold  - Single Arm Doorway Pec Stretch at 90 Degrees Abduction  - 3 x daily - 7 x weekly - 1 sets - 3 reps - 30 seconds hold   PT Short Term Goals - 11/02/21 0849       PT SHORT TERM GOAL #1   Title Pt will be independent with his initial HEP to decrease pain, improve strength, function, and ability to sleep.    Baseline Pt has not yet started his HEP (10/08/2021); able to do his HEP, no questions  (11/02/2021)    Time 3    Period Weeks    Status Achieved    Target Date 10/29/21              PT Long Term Goals - 10/08/21 0958       PT LONG TERM GOAL #1   Title Pt will have a decrease in neck pain to 2/10 or less at worst to promote ability to sleep/have a full night's rest.    Baseline 7/10 neck pain at worst for the past 3 months (10/08/2021)  Time 8    Period Weeks    Status New    Target Date 12/03/21      PT LONG TERM GOAL #2   Title Pt will report no L hand numbness to promote ability to sleep more comfortably.    Baseline 7/10 L hand numbness at most for the past 3 months (10/08/2021)    Time 8    Period Weeks    Status New    Target Date 12/03/21      PT LONG TERM GOAL #3   Title Pt will improve bilateral lower trap strength to decrease upper trap tension on neck and promote ability to sleep and perform UE tasks more comfortably.    Baseline Seated manually resisted scapular retraction isometrics targeting lower trap: R 4-/5, L 4/5 secondary to pt unable to be on prone position (10/08/2021)    Time 8    Period Weeks    Status New    Target Date 12/03/21      PT LONG TERM GOAL #4   Title Pt will improve his neck FOTO score by at least 10 points as a demonstration of improved function.    Baseline neck FOTO 72 (10/08/2021)    Time 8    Period Weeks    Status New    Target Date 12/03/21             Max Fickle, PT, DPT, OCS  [phone]    11/09/2021, 3:35 PM    [place] [facility] [address] [place], [address] Phone: [phone]   Fax:  [phone]  Patient Details  Name: [name] [name] MRN: [id number] Date of Birth: 1950/02/25 Referring Provider:  Alan Mulder, MD  Encounter Date: 11/09/2021   Ardine Bjork, PT 11/09/2021, 3:35 PM  [place] [facility] SPORTS MEDIC[facility] [address] [place], [address] Phone:  [phone]   Fax:  [phone]

## 2021-11-09 NOTE — Therapy (Deleted)
OUTPATIENT PHYSICAL THERAPY TREATMENT NOTE   Patient Name: [name] [name] MRN: [id number] DOB:17-Aug-1950, 71 y.o., male Today's Date: 11/09/2021  PCP: Alan Mulder, MD REFERRING PROVIDER: Alan Mulder, MD   PT End of Session - 11/09/21 1327     Visit Number 7    Number of Visits 17    Date for PT Re-Evaluation 12/03/21    PT Start Time 0930    PT Stop Time 1015    PT Time Calculation (min) 45 min    Activity Tolerance Patient tolerated treatment well    Behavior During Therapy [facility] for tasks assessed/performed                 Past Medical History:  Diagnosis Date   Arthritis    Diabetes mellitus without complication ([facility])    type 2   Dyspnea    with excertion   Headache    rare   Hypertension    Pneumonia    as a child   Past Surgical History:  Procedure Laterality Date   BACK SURGERY     2005   cartarized     bleeding ulcer  2015   JOINT REPLACEMENT     Right total hip 5/29 2018   REPAIR QUADRICEPS / HAMSTRING MUSCLE     torn loose from knee 2009 5/25   TOTAL HIP ARTHROPLASTY Right 07/13/2016   Procedure: RIGHT TOTAL HIP ARTHROPLASTY ANTERIOR APPROACH;  Surgeon: Durene Romans, MD;  [facility];  Service: Orthopedics;  Laterality: Right;   TOTAL HIP ARTHROPLASTY Left 11/30/2016   Procedure: LEFT TOTAL HIP ARTHROPLASTY ANTERIOR APPROACH;  Surgeon: Durene Romans, MD;  [facility];  Service: Orthopedics;  Laterality: Left;  70 mins   Patient Active Problem List   Diagnosis Date Noted   S/P left THA, 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 Feels B UE paresthesia L > R. Keeps him awake at night. Pain began about a couple of years ago, gradual onset. Was laying on his R side a couple of months ago and could not pick up  his head. Has not happened since. Pt sleeps on his L side. Sleeping with his L arm to the side makes it numb. Only imaging is an x-ray. Gets pain in B upper trap pain for the past couple of months and also has posterior headaches. Had PT for low back before and L Achilles tendon tear which have improved. Also has B THA. L hand gets numb to the point where he cannot grip. Happens to R hand as well but not as often. Pt is mainly a side sleeper. Sleeps on the back at times. Pt is R hand dominant. Used to sleep on his L side more but not now. Pt sometimes feels off balance when he wakes up in the morning. 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  PRECAUTIONS: No known precautions  SUBJECTIVE: *** Neck is feeling fine. L arm to hand (ulnar nerve pathway) feels a little numb. R UE is fine.     PAIN:  Are you having pain? See subjective     TODAY'S TREATMENT:      Therapeutic exercise  Standing L first  rib stretch with strap 30 seconds x 5  Slight decreased L UE paresthesia  Seated thoracic extension at chair 10 x 5 seconds for 3 sets  Standing B shoulder extension with B scapular retraction blue band 10x3 with 5 second holds to promote upper thoracic extension   Seated levator stretch   L 30 seconds x 3   Lights on and off during first stretch but not the following 2 stretches. PT was informed after all 3 repetitions were finished.    Pt was recommended to contact his MD if he senses the lights flickering again. Pt verbalized understanding.    Improved exercise technique, movement at target joints, use of target muscles after mod verbal, visual, tactile cues.      Manual therapy  Seated STM L and R cervical paraspinal and L and R upper trap muscles to decrease tension.   Seated STM R pectoralis muscle to decrase tension     Response to treatment Pt  tolerated session well without aggravation of symptoms.  Decreased L UE paresthesia after session and after treatment to decrease L and R upper trap muscle tension.      Clinical impression ***  Decreased L UE paresthesia after session and after treatment to decrease L and R upper trap muscle tension. Continued working on improving thoracic extension to decrease extension stress to cervical spine. Pt tolerated session well without aggravation of symptoms. Pt will benefit from continued skilled physical therapy services to decrease pain, improve strength and function.          PATIENT EDUCATION: Education details: ther-ex, HEP Person educated: Patient Education method: Explanation, Demonstration, Tactile cues, Verbal cues, and Handouts Education comprehension: verbalized understanding and returned demonstration   HOME EXERCISE PROGRAM: Access Code: [email or web address] URL: [email or web address] Date: 10/12/2021 Prepared by: Joneen Boers  Exercises - Supine Deep Neck Flexor Nods  - 1 x daily - 7 x weekly - 3 sets - 10 reps - 5 seconds hold - Supine Shoulder Horizontal Abduction with Dumbbells  - 1 x daily - 7 x weekly - 3 sets - 10 reps - 5seconds hold  - Single Arm Shoulder Extension with Anchored Resistance  - 1 x daily - 7 x weekly - 3 sets - 10 reps - 5 seconds hold  - Single Arm Doorway Pec Stretch at 90 Degrees Abduction  - 3 x daily - 7 x weekly - 1 sets - 3 reps - 30 seconds hold   PT Short Term Goals - 11/02/21 0849       PT SHORT TERM GOAL #1   Title Pt will be independent with his initial HEP to decrease pain, improve strength, function, and ability to sleep.    Baseline Pt has not yet started his HEP (10/08/2021); able to do his HEP, no questions (11/02/2021)    Time 3    Period Weeks    Status Achieved    Target Date 10/29/21              PT Long Term Goals - 10/08/21 0958       PT LONG TERM GOAL #1   Title Pt will have a decrease in neck pain to  2/10 or less at worst to promote ability to sleep/have a full night's rest.    Baseline 7/10 neck pain at worst for the past 3 months (10/08/2021)    Time 8    Period Weeks    Status New    Target Date 12/03/21  PT LONG TERM GOAL #2   Title Pt will report no L hand numbness to promote ability to sleep more comfortably.    Baseline 7/10 L hand numbness at most for the past 3 months (10/08/2021)    Time 8    Period Weeks    Status New    Target Date 12/03/21      PT LONG TERM GOAL #3   Title Pt will improve bilateral lower trap strength to decrease upper trap tension on neck and promote ability to sleep and perform UE tasks more comfortably.    Baseline Seated manually resisted scapular retraction isometrics targeting lower trap: R 4-/5, L 4/5 secondary to pt unable to be on prone position (10/08/2021)    Time 8    Period Weeks    Status New    Target Date 12/03/21      PT LONG TERM GOAL #4   Title Pt will improve his neck FOTO score by at least 10 points as a demonstration of improved function.    Baseline neck FOTO 72 (10/08/2021)    Time 8    Period Weeks    Status New    Target Date 12/03/21            Max Fickle, PT, DPT, OCS  [phone]  11/09/2021, 1:28 PM     [place] [facility] PH[facility] AND SPORTS MEDICINE [address] [place], [address] Phone: [phone]   Fax:  [phone]  Patient Details  Name: [name] [name] MRN: [id number] Date of Birth: Mar 05, 1950 Referring Provider:  Alan Mulder, MD  Encounter Date: 11/09/2021   Ardine Bjork, PT 11/09/2021, 1:28 PM  [place] [facility] PHYSICAL AND SPORTS MEDICINE [address], [place], [address] Phone: [phone]   Fax:  [phone]

## 2021-11-11 ENCOUNTER — Ambulatory Visit: Payer: Medicare Other

## 2021-11-11 DIAGNOSIS — M542 Cervicalgia: Secondary | ICD-10-CM

## 2021-11-11 DIAGNOSIS — M5412 Radiculopathy, cervical region: Secondary | ICD-10-CM

## 2021-11-11 NOTE — Therapy (Signed)
OUTPATIENT PHYSICAL THERAPY TREATMENT NOTE   Patient Name: [name] [name] MRN: [id number] DOB:1950-08-08, 71 y.o., male Today's Date: 11/11/2021  PCP: Lenard Simmer, MD REFERRING PROVIDER: Lenard Simmer, MD   PT End of Session - 11/11/21 0849     Visit Number 8    Number of Visits 17    Date for PT Re-Evaluation 12/03/21    PT Start Time 0850    PT Stop Time 0929    PT Time Calculation (min) 39 min    Activity Tolerance Patient tolerated treatment well    Behavior During Therapy [facility] for tasks assessed/performed                 Past Medical History:  Diagnosis Date   Arthritis    Diabetes mellitus without complication ([place])    type 2   Dyspnea    with excertion   Headache    rare   Hypertension    Pneumonia    as a child   Past Surgical History:  Procedure Laterality Date   BACK SURGERY     2005   cartarized     bleeding ulcer  2015   JOINT REPLACEMENT     Right total hip 5/29 2018   REPAIR QUADRICEPS / HAMSTRING MUSCLE     torn loose from knee 2009 5/25   TOTAL HIP ARTHROPLASTY Right 07/13/2016   Procedure: RIGHT TOTAL HIP ARTHROPLASTY ANTERIOR APPROACH;  Surgeon: Paralee Cancel, MD;  [facility];  Service: Orthopedics;  Laterality: Right;   TOTAL HIP ARTHROPLASTY Left 11/30/2016   Procedure: LEFT TOTAL HIP ARTHROPLASTY ANTERIOR APPROACH;  Surgeon: Paralee Cancel, MD;  [facility];  Service: Orthopedics;  Laterality: Left;  70 mins   Patient Active Problem List   Diagnosis Date Noted   S/P left THA, 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 Feels B UE paresthesia L > R. Keeps him awake at night. Pain began about a couple of years ago, gradual onset. Was laying on his R side a couple of months ago and could not pick  up his head. Has not happened since. Pt sleeps on his L side. Sleeping with his L arm to the side makes it numb. Only imaging is an x-ray. Gets pain in B upper trap pain for the past couple of months and also has posterior headaches. Had PT for low back before and L Achilles tendon tear which have improved. Also has B THA. L hand gets numb to the point where he cannot grip. Happens to R hand as well but not as often. Pt is mainly a side sleeper. Sleeps on the back at times. Pt is R hand dominant. Used to sleep on his L side more but not now. Pt sometimes feels off balance when he wakes up in the morning. 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  PRECAUTIONS: No known precautions  SUBJECTIVE: No L hand numbness currently. Felt L hand numbness this morning.  Neck feels more stiff in the morning. Feels stiff when turning his head. No neck pain currently. 2/10 neck pain at most for the past 7 days... Feels then neck there and its aggravating.  not painful upon arrival.  He continues to note intermittent L UE parasthesias, specifically in finger tips- he states digits 2-5.  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  He tries to take standing breaks outside occasionally too.    PAIN:  Are you having pain? See subjective     TODAY'S TREATMENT:      Therapeutic exercise  Seated cervical rotation AROM L 50 degrees R 55 degrees  Supine cervical rotation AROM  L 60 degrees R 62 degrees  Supine cervical nod 10x3 with 5 second holds  Supine with head on occipital float  R and L cervical rotation, neutral neck 10x3 each direction   B scapular retraction 10x5 seconds for 2 sets   Seated chin tuck 10x2 with 5 second holds to decrease lower cervical extension stress     Improved exercise technique, movement at target joints, use  of target muscles after mod verbal, visual, tactile cues.     Manual therapy  Seated STM L and R cervical paraspinal and L and R upper trap muscles to decrease tension and fascial restrictions   Decreased neck stiffness with cervical rotation reported afterwards    Response to treatment Pt tolerated session well without aggravation of symptoms.      Clinical impression   Worked on improving cervical posture, and anterior cervical muscle activation to improve mechanics and decrease lower cervical extension stress.  Pt tolerated session well without aggravation of symptoms during. Pt will benefit from continued skilled physical therapy services to decrease pain, improve strength and function.      PATIENT EDUCATION: Education details: ther-ex, HEP Person educated: Patient Education method: Explanation, Demonstration, Tactile cues, Verbal cues, and Handouts Education comprehension: verbalized understanding and returned demonstration   HOME EXERCISE PROGRAM: Access Code: [email or web address] URL: [email or web address] Date: 10/12/2021 Prepared by: Loralyn Freshwater  Exercises - Supine Deep Neck Flexor Nods  - 1 x daily - 7 x weekly - 3 sets - 10 reps - 5 seconds hold - Supine Shoulder Horizontal Abduction with Dumbbells  - 1 x daily - 7 x weekly - 3 sets - 10 reps - 5seconds hold  - Single Arm Shoulder Extension with Anchored Resistance  - 1 x daily - 7 x weekly - 3 sets - 10 reps - 5 seconds hold  - Single Arm Doorway Pec Stretch at 90 Degrees Abduction  - 3 x daily - 7 x weekly - 1 sets - 3 reps - 30 seconds hold - Supine Cervical Rotation AROM on Pillow  - 1 x daily - 7 x weekly - 3 sets - 10 reps - Seated Cervical Retraction  - 1 x daily - 7 x weekly - 3 sets - 10 reps - 5 seconds hold       PT Short Term Goals - 11/02/21 0849       PT SHORT TERM GOAL #1   Title Pt will be independent with his initial HEP to decrease pain, improve strength, function, and ability to  sleep.    Baseline Pt has not yet started his HEP (10/08/2021); able to do his HEP, no questions (11/02/2021)    Time 3    Period Weeks    Status Achieved    Target Date 10/29/21              PT Long Term Goals - 10/08/21 0958       PT LONG TERM GOAL #1   Title Pt will have  a decrease in neck pain to 2/10 or less at worst to promote ability to sleep/have a full night's rest.    Baseline 7/10 neck pain at worst for the past 3 months (10/08/2021)    Time 8    Period Weeks    Status New    Target Date 12/03/21      PT LONG TERM GOAL #2   Title Pt will report no L hand numbness to promote ability to sleep more comfortably.    Baseline 7/10 L hand numbness at most for the past 3 months (10/08/2021)    Time 8    Period Weeks    Status New    Target Date 12/03/21      PT LONG TERM GOAL #3   Title Pt will improve bilateral lower trap strength to decrease upper trap tension on neck and promote ability to sleep and perform UE tasks more comfortably.    Baseline Seated manually resisted scapular retraction isometrics targeting lower trap: R 4-/5, L 4/5 secondary to pt unable to be on prone position (10/08/2021)    Time 8    Period Weeks    Status New    Target Date 12/03/21      PT LONG TERM GOAL #4   Title Pt will improve his neck FOTO score by at least 10 points as a demonstration of improved function.    Baseline neck FOTO 72 (10/08/2021)    Time 8    Period Weeks    Status New    Target Date 12/03/21              Plan - 11/11/21 1116     Clinical Impression Statement Worked on improving cervical posture, and anterior cervical muscle activation to improve mechanics and decrease lower cervical extension stress.  Pt tolerated session well without aggravation of symptoms during. Pt will benefit from continued skilled physical therapy services to decrease pain, improve strength and function.    Personal Factors and Comorbidities Age;Comorbidity 3+;Fitness;Past/Current  Experience;Time since onset of injury/illness/exacerbation    Comorbidities Arthritis, DM, HTN, dyspnea, hx of back surgery, B THA    Examination-Activity Limitations Bed Mobility;Sleep    Stability/Clinical Decision Making Stable/Uncomplicated    Rehab Potential Fair    PT Frequency 2x / week    PT Duration 8 weeks    PT Treatment/Interventions Therapeutic exercise;Therapeutic activities;Manual techniques;Electrical Stimulation;Iontophoresis 4mg /ml Dexamethasone;Traction;Neuromuscular re-education;Patient/family education;Dry needling;Vestibular;Canalith Repostioning;Spinal Manipulations;Joint Manipulations   traction and/or manipulation if appropriate   PT Next Visit Plan posture, thoracic extension, scapular strengthening, decrease muscle tension, manual techniques, modalities PRN    PT Home Exercise Plan Medbridge Access Code: [email or web address]    Consulted and Agree with Plan of Care Patient                  PT, DPT   11/11/2021, 11:21 AM

## 2021-11-11 NOTE — Therapy (Deleted)
OUTPATIENT PHYSICAL THERAPY TREATMENT NOTE   Patient Name: [name] [name] MRN: [id number] DOB:1950-10-13, 71 y.o., male Today's Date: 11/11/2021  PCP: Alan Mulder, MD REFERRING PROVIDER: Alan Mulder, MD   PT End of Session - 11/11/21 0849     Visit Number 8    Number of Visits 17    Date for PT Re-Evaluation 12/03/21    PT Start Time 0850    PT Stop Time 0929    PT Time Calculation (min) 39 min    Activity Tolerance Patient tolerated treatment well    Behavior During Therapy [facility] for tasks assessed/performed                  Past Medical History:  Diagnosis Date   Arthritis    Diabetes mellitus without complication (HCC)    type 2   Dyspnea    with excertion   Headache    rare   Hypertension    Pneumonia    as a child   Past Surgical History:  Procedure Laterality Date   BACK SURGERY     2005   cartarized     bleeding ulcer  2015   JOINT REPLACEMENT     Right total hip 5/29 2018   REPAIR QUADRICEPS / HAMSTRING MUSCLE     torn loose from knee 2009 5/25   TOTAL HIP ARTHROPLASTY Right 07/13/2016   Procedure: RIGHT TOTAL HIP ARTHROPLASTY ANTERIOR APPROACH;  Surgeon: Durene Romans, MD;  Location: [facility];  Service: Orthopedics;  Laterality: Right;   TOTAL HIP ARTHROPLASTY Left 11/30/2016   Procedure: LEFT TOTAL HIP ARTHROPLASTY ANTERIOR APPROACH;  Surgeon: Durene Romans, MD;  Location: [facility];  Service: Orthopedics;  Laterality: Left;  70 mins   Patient Active Problem List   Diagnosis Date Noted   S/P left THA, 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 Feels B UE paresthesia L > R. Keeps him awake at night. Pain began about a couple of years ago, gradual onset. Was laying on his R side a couple of months ago and could not pick  up his head. Has not happened since. Pt sleeps on his L side. Sleeping with his L arm to the side makes it numb. Only imaging is an x-ray. Gets pain in B upper trap pain for the past couple of months and also has posterior headaches. Had PT for low back before and L Achilles tendon tear which have improved. Also has B THA. L hand gets numb to the point where he cannot grip. Happens to R hand as well but not as often. Pt is mainly a side sleeper. Sleeps on the back at times. Pt is R hand dominant. Used to sleep on his L side more but not now. Pt sometimes feels off balance when he wakes up in the morning. 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  PRECAUTIONS: No known precautions  SUBJECTIVE: No L hand numbness currently. Felt L hand numbness this morning.  Neck feels more stiff in the morning. Feels stiff when turning his head. No neck pain currently. 2/10 neck pain at most for the past 7 days... Feels then neck there and its aggravating.  not painful upon arrival.  He continues to note intermittent L UE parasthesias, specifically in finger tips- he states digits 2-5.  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  He tries to take standing breaks outside occasionally too.    PAIN:  Are you having pain? See subjective     TODAY'S TREATMENT:      Therapeutic exercise  Seated cervical rotation AROM L 50 degrees R 55 degrees  Supine cervical rotation AROM  L 60 degrees R 62 degrees  Supine cervical nod 10x3 with 5 second holds  Supine with head on occipital float  R and L cervical rotation, neutral neck 10x3 each direction   B scapular retraction 10x5 seconds for 2 sets   Seated chin tuck 10x2 with 5 second holds to decrease lower cervical extension stress     Improved exercise technique, movement at target joints, use  of target muscles after mod verbal, visual, tactile cues.     Manual therapy  Seated STM L and R cervical paraspinal and L and R upper trap muscles to decrease tension and fascial restrictions   Decreased neck stiffness with cervical rotation reported afterwards    Response to treatment Pt tolerated session well without aggravation of symptoms.      Clinical impression   Worked on improving cervical posture, and anterior cervical muscle activation to improve mechanics and decrease lower cervical extension stress.  Pt tolerated session well without aggravation of symptoms during scapular mm retraining. Pt will benefit from continued skilled physical therapy services to decrease pain, improve strength and function.      PATIENT EDUCATION: Education details: ther-ex, HEP Person educated: Patient Education method: Explanation, Demonstration, Tactile cues, Verbal cues, and Handouts Education comprehension: verbalized understanding and returned demonstration   HOME EXERCISE PROGRAM: Access Code: [email or web address] URL: [email or web address] Date: 10/12/2021 Prepared by: Loralyn Freshwater  Exercises - Supine Deep Neck Flexor Nods  - 1 x daily - 7 x weekly - 3 sets - 10 reps - 5 seconds hold - Supine Shoulder Horizontal Abduction with Dumbbells  - 1 x daily - 7 x weekly - 3 sets - 10 reps - 5seconds hold  - Single Arm Shoulder Extension with Anchored Resistance  - 1 x daily - 7 x weekly - 3 sets - 10 reps - 5 seconds hold  - Single Arm Doorway Pec Stretch at 90 Degrees Abduction  - 3 x daily - 7 x weekly - 1 sets - 3 reps - 30 seconds hold - Supine Cervical Rotation AROM on Pillow  - 1 x daily - 7 x weekly - 3 sets - 10 reps - Seated Cervical Retraction  - 1 x daily - 7 x weekly - 3 sets - 10 reps - 5 seconds hold   PT Short Term Goals - 11/02/21 0849       PT SHORT TERM GOAL #1   Title Pt will be independent with his initial HEP to decrease pain, improve strength, function,  and ability to sleep.    Baseline Pt has not yet started his HEP (10/08/2021); able to do his HEP, no questions (11/02/2021)    Time 3    Period Weeks    Status Achieved    Target Date 10/29/21              PT Long Term Goals - 10/08/21 0958       PT LONG TERM GOAL #1   Title Pt will have a  decrease in neck pain to 2/10 or less at worst to promote ability to sleep/have a full night's rest.    Baseline 7/10 neck pain at worst for the past 3 months (10/08/2021)    Time 8    Period Weeks    Status New    Target Date 12/03/21      PT LONG TERM GOAL #2   Title Pt will report no L hand numbness to promote ability to sleep more comfortably.    Baseline 7/10 L hand numbness at most for the past 3 months (10/08/2021)    Time 8    Period Weeks    Status New    Target Date 12/03/21      PT LONG TERM GOAL #3   Title Pt will improve bilateral lower trap strength to decrease upper trap tension on neck and promote ability to sleep and perform UE tasks more comfortably.    Baseline Seated manually resisted scapular retraction isometrics targeting lower trap: R 4-/5, L 4/5 secondary to pt unable to be on prone position (10/08/2021)    Time 8    Period Weeks    Status New    Target Date 12/03/21      PT LONG TERM GOAL #4   Title Pt will improve his neck FOTO score by at least 10 points as a demonstration of improved function.    Baseline neck FOTO 72 (10/08/2021)    Time 8    Period Weeks    Status New    Target Date 12/03/21               Encounter Date: 11/11/2021  Joneen Boers PT, DPT  11/11/2021, 11:20 AM  [place] [facility] [address], [place], [address] Phone: [phone]   Fax:  [phone]

## 2021-11-16 ENCOUNTER — Ambulatory Visit: Payer: Medicare Other | Attending: Family Medicine

## 2021-11-16 DIAGNOSIS — M542 Cervicalgia: Secondary | ICD-10-CM | POA: Insufficient documentation

## 2021-11-16 DIAGNOSIS — M5412 Radiculopathy, cervical region: Secondary | ICD-10-CM | POA: Diagnosis present

## 2021-11-16 NOTE — Therapy (Signed)
OUTPATIENT PHYSICAL THERAPY TREATMENT NOTE   Patient Name: [name] [name] MRN: [id number] DOB:05-14-1950, 71 y.o., male Today's Date: 11/16/2021  PCP: Lenard Simmer, MD REFERRING PROVIDER: Lenard Simmer, MD   PT End of Session - 11/16/21 1012     Visit Number 9    Number of Visits 17    Date for PT Re-Evaluation 12/03/21    PT Start Time 1013    PT Stop Time 1054    PT Time Calculation (min) 41 min    Activity Tolerance Patient tolerated treatment well    Behavior During Therapy [facility] for tasks assessed/performed                  Past Medical History:  Diagnosis Date   Arthritis    Diabetes mellitus without complication ([place])    type 2   Dyspnea    with excertion   Headache    rare   Hypertension    Pneumonia    as a child   Past Surgical History:  Procedure Laterality Date   BACK SURGERY     2005   cartarized     bleeding ulcer  2015   JOINT REPLACEMENT     Right total hip 5/29 2018   REPAIR QUADRICEPS / HAMSTRING MUSCLE     torn loose from knee 2009 5/25   TOTAL HIP ARTHROPLASTY Right 07/13/2016   Procedure: RIGHT TOTAL HIP ARTHROPLASTY ANTERIOR APPROACH;  Surgeon: Paralee Cancel, MD;  Location: [facility];  Service: Orthopedics;  Laterality: Right;   TOTAL HIP ARTHROPLASTY Left 11/30/2016   Procedure: LEFT TOTAL HIP ARTHROPLASTY ANTERIOR APPROACH;  Surgeon: Paralee Cancel, MD;  Location: [facility];  Service: Orthopedics;  Laterality: Left;  70 mins   Patient Active Problem List   Diagnosis Date Noted   S/P left THA, 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 Feels B UE paresthesia L > R. Keeps him awake at night. Pain began about a couple of years ago, gradual onset. Was laying on his R side a couple of months ago and could not pick  up his head. Has not happened since. Pt sleeps on his L side. Sleeping with his L arm to the side makes it numb. Only imaging is an x-ray. Gets pain in B upper trap pain for the past couple of months and also has posterior headaches. Had PT for low back before and L Achilles tendon tear which have improved. Also has B THA. L hand gets numb to the point where he cannot grip. Happens to R hand as well but not as often. Pt is mainly a side sleeper. Sleeps on the back at times. Pt is R hand dominant. Used to sleep on his L side more but not now. Pt sometimes feels off balance when he wakes up in the morning. 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  PRECAUTIONS: No known precautions  SUBJECTIVE: Neck is alright right now. L upper trap has something. Neck feels stiff at times during the day. The numbness is not as bad as it has been. Just numbness at the times of his L fingers currently. L hand has not been numb 3-4 am. Sleeping  on R side. L hand has not been numb last few mornings.        PAIN:  Are you having pain? See subjective     TODAY'S TREATMENT:    Manual therapy Seated STM L infraspinatus to decrease muscle tension   Seated STM B cervical paraspinal and R upper trap muscles to decrease tension  Decreased numbness to L fingers reported.      Therapeutic exercise   L first rib stretch 30 seconds for 3 sets  Supine with head on occipital float  Supine cervical nod 10x with 10 second holds   R and L cervical rotation, neutral neck 10x3 each direction    B scapular retraction 10x5 seconds for 3 sets   B scapular depression 10x5 seconds     UE D1 flextion and extension    L 10x3   R 10x3    Give as part of HEP next visit if appropriate          Improved exercise technique, movement at target joints, use of target muscles after mod verbal,  visual, tactile cues.       Response to treatment Pt tolerated session well without aggravation of symptoms. No neck pain, stiffness or L hand paresthesias reported after session.      Clinical impression    Worked on decreasing cervical paraspinal and upper trap muscle tension to his neck, as well as improving anterior cervical muscle activation and improving B radial nerve movement. No L hand paresthesia, neck pain or stiffness reported after session. . Pt will benefit from continued skilled physical therapy services to decrease pain, improve strength and function.      PATIENT EDUCATION: Education details: ther-ex, HEP Person educated: Patient Education method: Explanation, Demonstration, Tactile cues, Verbal cues, and Handouts Education comprehension: verbalized understanding and returned demonstration   HOME EXERCISE PROGRAM: Access Code: [email or web address] URL: [email or web address] Date: 10/12/2021 Prepared by: Loralyn Freshwater  Exercises - Supine Deep Neck Flexor Nods  - 1 x daily - 7 x weekly - 3 sets - 10 reps - 5 seconds hold - Supine Shoulder Horizontal Abduction with Dumbbells  - 1 x daily - 7 x weekly - 3 sets - 10 reps - 5seconds hold  - Single Arm Shoulder Extension with Anchored Resistance  - 1 x daily - 7 x weekly - 3 sets - 10 reps - 5 seconds hold  - Single Arm Doorway Pec Stretch at 90 Degrees Abduction  - 3 x daily - 7 x weekly - 1 sets - 3 reps - 30 seconds hold - Supine Cervical Rotation AROM on Pillow  - 1 x daily - 7 x weekly - 3 sets - 10 reps - Seated Cervical Retraction  - 1 x daily - 7 x weekly - 3 sets - 10 reps - 5 seconds hold       PT Short Term Goals - 11/02/21 0849       PT SHORT TERM GOAL #1   Title Pt will be independent with his initial HEP to decrease pain, improve strength, function, and ability to sleep.    Baseline Pt has not yet started his HEP (10/08/2021); able to do his HEP, no questions (11/02/2021)    Time 3    Period  Weeks    Status Achieved    Target Date 10/29/21              PT Long Term Goals - 10/08/21 0958       PT LONG TERM  GOAL #1   Title Pt will have a decrease in neck pain to 2/10 or less at worst to promote ability to sleep/have a full night's rest.    Baseline 7/10 neck pain at worst for the past 3 months (10/08/2021)    Time 8    Period Weeks    Status New    Target Date 12/03/21      PT LONG TERM GOAL #2   Title Pt will report no L hand numbness to promote ability to sleep more comfortably.    Baseline 7/10 L hand numbness at most for the past 3 months (10/08/2021)    Time 8    Period Weeks    Status New    Target Date 12/03/21      PT LONG TERM GOAL #3   Title Pt will improve bilateral lower trap strength to decrease upper trap tension on neck and promote ability to sleep and perform UE tasks more comfortably.    Baseline Seated manually resisted scapular retraction isometrics targeting lower trap: R 4-/5, L 4/5 secondary to pt unable to be on prone position (10/08/2021)    Time 8    Period Weeks    Status New    Target Date 12/03/21      PT LONG TERM GOAL #4   Title Pt will improve his neck FOTO score by at least 10 points as a demonstration of improved function.    Baseline neck FOTO 72 (10/08/2021)    Time 8    Period Weeks    Status New    Target Date 12/03/21              Plan - 11/16/21 1011     Clinical Impression Statement Worked on decreasing cervical paraspinal and upper trap muscle tension to his neck, as well as improving anterior cervical muscle activation and improving B radial nerve movement. No L hand paresthesia, neck pain or stiffness reported after session. . Pt will benefit from continued skilled physical therapy services to decrease pain, improve strength and function.    Personal Factors and Comorbidities Age;Comorbidity 3+;Fitness;Past/Current Experience;Time since onset of injury/illness/exacerbation    Comorbidities Arthritis, DM, HTN,  dyspnea, hx of back surgery, B THA    Examination-Activity Limitations Bed Mobility;Sleep    Stability/Clinical Decision Making Stable/Uncomplicated    Clinical Decision Making Low    Rehab Potential Fair    PT Frequency 2x / week    PT Duration 8 weeks    PT Treatment/Interventions Therapeutic exercise;Therapeutic activities;Manual techniques;Electrical Stimulation;Iontophoresis 4mg /ml Dexamethasone;Traction;Neuromuscular re-education;Patient/family education;Dry needling;Vestibular;Canalith Repostioning;Spinal Manipulations;Joint Manipulations   traction and/or manipulation if appropriate   PT Next Visit Plan posture, thoracic extension, scapular strengthening, decrease muscle tension, manual techniques, modalities PRN    PT Home Exercise Plan Medbridge Access Code: [email or web address]    Consulted and Agree with Plan of Care Patient                  PT, DPT   11/16/2021, 10:59 AM

## 2021-11-19 ENCOUNTER — Ambulatory Visit: Payer: Medicare Other

## 2021-11-19 DIAGNOSIS — M5412 Radiculopathy, cervical region: Secondary | ICD-10-CM

## 2021-11-19 DIAGNOSIS — M542 Cervicalgia: Secondary | ICD-10-CM | POA: Diagnosis not present

## 2021-11-19 NOTE — Therapy (Signed)
OUTPATIENT PHYSICAL THERAPY TREATMENT NOTE And Progress Report (10/08/2021 - 11/19/2021)   Patient Name: [name] [name] MRN: [id number] DOB:02/10/1951, 71 y.o., male Today's Date: 11/19/2021  PCP: Lenard Simmer, MD REFERRING PROVIDER: Lenard Simmer, MD   PT End of Session - 11/19/21 0803     Visit Number 10    Number of Visits 17    Date for PT Re-Evaluation 12/03/21    PT Start Time 0803    PT Stop Time 8916    PT Time Calculation (min) 44 min    Activity Tolerance Patient tolerated treatment well    Behavior During Therapy [facility] for tasks assessed/performed                   Past Medical History:  Diagnosis Date   Arthritis    Diabetes mellitus without complication ([place])    type 2   Dyspnea    with excertion   Headache    rare   Hypertension    Pneumonia    as a child   Past Surgical History:  Procedure Laterality Date   BACK SURGERY     2005   cartarized     bleeding ulcer  2015   JOINT REPLACEMENT     Right total hip 5/29 2018   REPAIR QUADRICEPS / HAMSTRING MUSCLE     torn loose from knee 2009 5/25   TOTAL HIP ARTHROPLASTY Right 07/13/2016   Procedure: RIGHT TOTAL HIP ARTHROPLASTY ANTERIOR APPROACH;  Surgeon: Paralee Cancel, MD;  Location: [facility];  Service: Orthopedics;  Laterality: Right;   TOTAL HIP ARTHROPLASTY Left 11/30/2016   Procedure: LEFT TOTAL HIP ARTHROPLASTY ANTERIOR APPROACH;  Surgeon: Paralee Cancel, MD;  Location: [facility];  Service: Orthopedics;  Laterality: Left;  70 mins   Patient Active Problem List   Diagnosis Date Noted   S/P left THA, 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 Feels B UE paresthesia L > R. Keeps him awake at night. Pain began about a couple of years ago, gradual onset. Was laying on  his R side a couple of months ago and could not pick up his head. Has not happened since. Pt sleeps on his L side. Sleeping with his L arm to the side makes it numb. Only imaging is an x-ray. Gets pain in B upper trap pain for the past couple of months and also has posterior headaches. Had PT for low back before and L Achilles tendon tear which have improved. Also has B THA. L hand gets numb to the point where he cannot grip. Happens to R hand as well but not as often. Pt is mainly a side sleeper. Sleeps on the back at times. Pt is R hand dominant. Used to sleep on his L side more but not now. Pt sometimes feels off balance when he wakes up in the morning. 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  PRECAUTIONS: No known precautions  SUBJECTIVE: Neck is a little stiff this morning, same muscles tense. Feels like he can roll back and forth a little more in bed. L hand does not go numb as fast now.     Neck is alright right  now. L upper trap has something. Neck feels stiff at times during the day. The numbness is not as bad as it has been. Just numbness at the times of his L fingers currently. L hand has not been numb 3-4 am. Sleeping on R side. L hand has not been numb last few mornings.        PAIN:  Are you having pain? See subjective. None, just stiffness.      TODAY'S TREATMENT:       Therapeutic exercise   L first rib stretch 30 seconds for 3 sets  No change in numbnress L distal fingers  Seated manually resisted scapular retraction targeting lower trap muscles   L 10x5 seconds for 3 sets  R 10x5 seconds for 3 sets  Reviewed progress/current status with PT towards goals   Seated scapular depression isometrics  L 10x5 seconds for 3 sets  R 10x5 seconds for 3 sets  Supine   L UE D1 flextion and extension 10x3. Decreased paresthesia L fingers  L UE   median nerve floss 10x2. No change in L hand paresthesia          Improved exercise technique, movement at target joints, use of target muscles after mod verbal, visual, tactile cues.       Response to treatment Pt tolerated session well without aggravation of symptoms.     Clinical impression   Pt demonstrates significant decrease in neck pain, as well as improved function since initial evaluation. Still demonstrates L hand paresthesia but speed in which his L hand goes numb is slower. Overall, pt is making progress with PT towards goals.  Pt will benefit from continued skilled physical therapy services to decrease pain, L hand paresthesia, improve strength and function.      PATIENT EDUCATION: Education details: ther-ex, HEP Person educated: Patient Education method: Explanation, Demonstration, Tactile cues, Verbal cues, and Handouts Education comprehension: verbalized understanding and returned demonstration   HOME EXERCISE PROGRAM: Access Code: [email or web address] URL: [email or web address] Date: 10/12/2021 Prepared by: Joneen Boers  Exercises - Supine Deep Neck Flexor Nods  - 1 x daily - 7 x weekly - 3 sets - 10 reps - 5 seconds hold - Supine Shoulder Horizontal Abduction with Dumbbells  - 1 x daily - 7 x weekly - 3 sets - 10 reps - 5seconds hold  - Single Arm Shoulder Extension with Anchored Resistance  - 1 x daily - 7 x weekly - 3 sets - 10 reps - 5 seconds hold  - Single Arm Doorway Pec Stretch at 90 Degrees Abduction  - 3 x daily - 7 x weekly - 1 sets - 3 reps - 30 seconds hold - Supine Cervical Rotation AROM on Pillow  - 1 x daily - 7 x weekly - 3 sets - 10 reps - Seated Cervical Retraction  - 1 x daily - 7 x weekly - 3 sets - 10 reps - 5 seconds hold   L UE D1 flextion and extension 10x3.    PT Short Term Goals - 11/02/21 0849       PT SHORT TERM GOAL #1   Title Pt will be independent with his initial HEP to decrease pain, improve strength,  function, and ability to sleep.    Baseline Pt has not yet started his HEP (10/08/2021); able to do his HEP, no questions (11/02/2021)    Time 3    Period Weeks    Status Achieved    Target Date 10/29/21  PT Long Term Goals - 11/19/21 0805       PT LONG TERM GOAL #1   Title Pt will have a decrease in neck pain to 2/10 or less at worst to promote ability to sleep/have a full night's rest.    Baseline 7/10 neck pain at worst for the past 3 months (10/08/2021); 1/10 majority of the time (11/19/2021)    Time 8    Period Weeks    Status Achieved    Target Date 12/03/21      PT LONG TERM GOAL #2   Title Pt will report no L hand numbness to promote ability to sleep more comfortably.    Baseline 7/10 L hand numbness at most for the past 3 months (10/08/2021); L hand does not go numb as fast (11/19/2021)    Time 8    Period Weeks    Status Partially Met    Target Date 12/03/21      PT LONG TERM GOAL #3   Title Pt will improve bilateral lower trap strength to decrease upper trap tension on neck and promote ability to sleep and perform UE tasks more comfortably.    Baseline Seated manually resisted scapular retraction isometrics targeting lower trap: R 4-/5, L 4/5 secondary to pt unable to be on prone position (10/08/2021); R 4/5, L 4/5 (11/19/2021)    Time 8    Period Weeks    Status Partially Met    Target Date 12/03/21      PT LONG TERM GOAL #4   Title Pt will improve his neck FOTO score by at least 10 points as a demonstration of improved function.    Baseline neck FOTO 72 (10/08/2021); 96 (11/19/2021)    Time 8    Period Weeks    Status Achieved    Target Date 12/03/21              Plan - 11/19/21 4920     Clinical Impression Statement Pt demonstrates significant decrease in neck pain, as well as improved function since initial evaluation. Still demonstrates L hand paresthesia but speed in which his L hand goes numb is slower. Overall, pt is making progress with PT  towards goals.  Pt will benefit from continued skilled physical therapy services to decrease pain, L hand paresthesia, improve strength and function.    Personal Factors and Comorbidities Age;Comorbidity 3+;Fitness;Past/Current Experience;Time since onset of injury/illness/exacerbation    Comorbidities Arthritis, DM, HTN, dyspnea, hx of back surgery, B THA    Examination-Activity Limitations Bed Mobility;Sleep    Stability/Clinical Decision Making Stable/Uncomplicated    Clinical Decision Making Low    Rehab Potential Fair    PT Frequency 2x / week    PT Duration 8 weeks    PT Treatment/Interventions Therapeutic exercise;Therapeutic activities;Manual techniques;Electrical Stimulation;Iontophoresis 35m/ml Dexamethasone;Traction;Neuromuscular re-education;Patient/family education;Dry needling;Vestibular;Canalith Repostioning;Spinal Manipulations;Joint Manipulations   traction and/or manipulation if appropriate   PT Next Visit Plan posture, thoracic extension, scapular strengthening, decrease muscle tension, manual techniques, modalities PRN    PT Home Exercise Plan Medbridge Access Code: [email or web address]    Consulted and Agree with Plan of Care Patient               Thank you for your referral.     MJoneen BoersPT, DPT   11/19/2021, 11:39 AM

## 2021-11-24 ENCOUNTER — Ambulatory Visit: Payer: Medicare Other

## 2021-11-24 DIAGNOSIS — M5412 Radiculopathy, cervical region: Secondary | ICD-10-CM

## 2021-11-24 DIAGNOSIS — M542 Cervicalgia: Secondary | ICD-10-CM

## 2021-11-24 NOTE — Therapy (Signed)
OUTPATIENT PHYSICAL THERAPY TREATMENT NOTE    Patient Name: [name] [name] MRN: [id number] DOB:01-03-1951, 71 y.o., male Today's Date: 11/24/2021  PCP: Lenard Simmer, MD REFERRING PROVIDER: Lenard Simmer, MD   PT End of Session - 11/24/21 0805     Visit Number 11    Number of Visits 17    Date for PT Re-Evaluation 12/03/21    PT Start Time 0805    PT Stop Time 2694    PT Time Calculation (min) 39 min    Activity Tolerance Patient tolerated treatment well    Behavior During Therapy [facility] for tasks assessed/performed                    Past Medical History:  Diagnosis Date   Arthritis    Diabetes mellitus without complication ([place])    type 2   Dyspnea    with excertion   Headache    rare   Hypertension    Pneumonia    as a child   Past Surgical History:  Procedure Laterality Date   BACK SURGERY     2005   cartarized     bleeding ulcer  2015   JOINT REPLACEMENT     Right total hip 5/29 2018   REPAIR QUADRICEPS / HAMSTRING MUSCLE     torn loose from knee 2009 5/25   TOTAL HIP ARTHROPLASTY Right 07/13/2016   Procedure: RIGHT TOTAL HIP ARTHROPLASTY ANTERIOR APPROACH;  Surgeon: Paralee Cancel, MD;  Location: [facility];  Service: Orthopedics;  Laterality: Right;   TOTAL HIP ARTHROPLASTY Left 11/30/2016   Procedure: LEFT TOTAL HIP ARTHROPLASTY ANTERIOR APPROACH;  Surgeon: Paralee Cancel, MD;  Location: [facility];  Service: Orthopedics;  Laterality: Left;  70 mins   Patient Active Problem List   Diagnosis Date Noted   S/P left THA, 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 Feels B UE paresthesia L > R. Keeps him awake at night. Pain began about a couple of years ago, gradual onset. Was laying on his R side a couple of months ago and could  not pick up his head. Has not happened since. Pt sleeps on his L side. Sleeping with his L arm to the side makes it numb. Only imaging is an x-ray. Gets pain in B upper trap pain for the past couple of months and also has posterior headaches. Had PT for low back before and L Achilles tendon tear which have improved. Also has B THA. L hand gets numb to the point where he cannot grip. Happens to R hand as well but not as often. Pt is mainly a side sleeper. Sleeps on the back at times. Pt is R hand dominant. Used to sleep on his L side more but not now. Pt sometimes feels off balance when he wakes up in the morning. 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  PRECAUTIONS: No known precautions  SUBJECTIVE: Neck is a little stiff this morning, no pain. Has real light numbness at the tips of the L fingers. Used to be the whole hand.     PAIN:  Are you having pain? See subjective. None, just stiffness.  TODAY'S TREATMENT:       Therapeutic exercise  Seated thoracic extension over chair with chin tuck 10x5 seconds for 3 sets  First rib stretch with strap with R and L cervical rotation    L 10x3   R 10x3  Standing B shoulder extension with scapular retraction Blue band 10x5 seconds for 3 sets  Supine L UE median nerve tension: no change in symptoms with R cervical side bend or elbow and wrist flexion and extension.     Improved exercise technique, movement at target joints, use of target muscles after mod verbal, visual, tactile cues.     Manual therapy Supine with head on occipital float STM cervical paraspinals B  STM R uppper trap muscle to decrease tension  STM R pectoralis major muscle to decrease tension       Response to treatment Pt tolerated session well without aggravation of symptoms.     Clinical impression   Continued working on  improving upper thoracic extension to decrease lower cervical extension stress as well as decreasing B cervical paraspinal, upper trap and scalene muscle tension to neck. Pt tolerated session well without aggravation of symptoms. Decreasing overall L UE paresthesia based on subjective reports. Pt will benefit from continued skilled physical therapy services to decrease pain, L hand paresthesia, improve strength and function.      PATIENT EDUCATION: Education details: ther-ex, HEP Person educated: Patient Education method: Explanation, Demonstration, Tactile cues, Verbal cues, and Handouts Education comprehension: verbalized understanding and returned demonstration   HOME EXERCISE PROGRAM: Access Code: [email or web address] URL: [email or web address] Date: 10/12/2021 Prepared by: Joneen Boers  Exercises - Supine Deep Neck Flexor Nods  - 1 x daily - 7 x weekly - 3 sets - 10 reps - 5 seconds hold - Supine Shoulder Horizontal Abduction with Dumbbells  - 1 x daily - 7 x weekly - 3 sets - 10 reps - 5seconds hold  - Single Arm Shoulder Extension with Anchored Resistance  - 1 x daily - 7 x weekly - 3 sets - 10 reps - 5 seconds hold  - Single Arm Doorway Pec Stretch at 90 Degrees Abduction  - 3 x daily - 7 x weekly - 1 sets - 3 reps - 30 seconds hold - Supine Cervical Rotation AROM on Pillow  - 1 x daily - 7 x weekly - 3 sets - 10 reps - Seated Cervical Retraction  - 1 x daily - 7 x weekly - 3 sets - 10 reps - 5 seconds hold   L UE D1 flextion and extension 10x3.    PT Short Term Goals - 11/02/21 0849       PT SHORT TERM GOAL #1   Title Pt will be independent with his initial HEP to decrease pain, improve strength, function, and ability to sleep.    Baseline Pt has not yet started his HEP (10/08/2021); able to do his HEP, no questions (11/02/2021)    Time 3    Period Weeks    Status Achieved    Target Date 10/29/21              PT Long Term Goals - 11/19/21 0805       PT  LONG TERM GOAL #1   Title Pt will have a decrease in neck pain to 2/10 or less at worst to promote ability to sleep/have a full night's rest.    Baseline 7/10 neck pain at worst for the past 3 months (10/08/2021); 1/10 majority of the time (  11/19/2021)    Time 8    Period Weeks    Status Achieved    Target Date 12/03/21      PT LONG TERM GOAL #2   Title Pt will report no L hand numbness to promote ability to sleep more comfortably.    Baseline 7/10 L hand numbness at most for the past 3 months (10/08/2021); L hand does not go numb as fast (11/19/2021)    Time 8    Period Weeks    Status Partially Met    Target Date 12/03/21      PT LONG TERM GOAL #3   Title Pt will improve bilateral lower trap strength to decrease upper trap tension on neck and promote ability to sleep and perform UE tasks more comfortably.    Baseline Seated manually resisted scapular retraction isometrics targeting lower trap: R 4-/5, L 4/5 secondary to pt unable to be on prone position (10/08/2021); R 4/5, L 4/5 (11/19/2021)    Time 8    Period Weeks    Status Partially Met    Target Date 12/03/21      PT LONG TERM GOAL #4   Title Pt will improve his neck FOTO score by at least 10 points as a demonstration of improved function.    Baseline neck FOTO 72 (10/08/2021); 96 (11/19/2021)    Time 8    Period Weeks    Status Achieved    Target Date 12/03/21              Plan - 11/24/21 0805     Clinical Impression Statement Continued working on improving upper thoracic extension to decrease lower cervical extension stress as well as decreasing B cervical paraspinal, upper trap and scalene muscle tension to neck. Pt tolerated session well without aggravation of symptoms. Decreasing overall L UE paresthesia based on subjective reports. Pt will benefit from continued skilled physical therapy services to decrease pain, L hand paresthesia, improve strength and function.    Personal Factors and Comorbidities Age;Comorbidity  3+;Fitness;Past/Current Experience;Time since onset of injury/illness/exacerbation    Comorbidities Arthritis, DM, HTN, dyspnea, hx of back surgery, B THA    Examination-Activity Limitations Bed Mobility;Sleep    Stability/Clinical Decision Making Stable/Uncomplicated    Clinical Decision Making Low    Rehab Potential Fair    PT Frequency 2x / week    PT Duration 8 weeks    PT Treatment/Interventions Therapeutic exercise;Therapeutic activities;Manual techniques;Electrical Stimulation;Iontophoresis 48m/ml Dexamethasone;Traction;Neuromuscular re-education;Patient/family education;Dry needling;Vestibular;Canalith Repostioning;Spinal Manipulations;Joint Manipulations   traction and/or manipulation if appropriate   PT Next Visit Plan posture, thoracic extension, scapular strengthening, decrease muscle tension, manual techniques, modalities PRN    PT Home Exercise Plan Medbridge Access Code: [email or web address]    Consulted and Agree with Plan of Care Patient                    MJoneen BoersPT, DPT   11/24/2021, 11:29 AM

## 2021-11-26 ENCOUNTER — Ambulatory Visit: Payer: Medicare Other

## 2021-11-26 DIAGNOSIS — M5412 Radiculopathy, cervical region: Secondary | ICD-10-CM

## 2021-11-26 DIAGNOSIS — M542 Cervicalgia: Secondary | ICD-10-CM | POA: Diagnosis not present

## 2021-11-26 NOTE — Therapy (Signed)
OUTPATIENT PHYSICAL THERAPY TREATMENT NOTE And Discharge Summary    Patient Name: [name] [name] MRN: [id number] DOB:[birth year], 71 y.o., male Today's Date: 11/26/2021  PCP: Lenard Simmer, MD REFERRING PROVIDER: Lenard Simmer, MD   PT End of Session - 11/26/21 0803     Visit Number 12    Number of Visits 17    Date for PT Re-Evaluation 12/03/21    PT Start Time 0803    PT Stop Time 0848    PT Time Calculation (min) 45 min    Activity Tolerance Patient tolerated treatment well    Behavior During Therapy [facility] for tasks assessed/performed                     Past Medical History:  Diagnosis Date   Arthritis    Diabetes mellitus without complication ([place])    type 2   Dyspnea    with excertion   Headache    rare   Hypertension    Pneumonia    as a child   Past Surgical History:  Procedure Laterality Date   BACK SURGERY     2005   cartarized     bleeding ulcer  2015   JOINT REPLACEMENT     Right total hip 5/29 2018   REPAIR QUADRICEPS / HAMSTRING MUSCLE     torn loose from knee 2009 5/25   TOTAL HIP ARTHROPLASTY Right 07/13/2016   Procedure: RIGHT TOTAL HIP ARTHROPLASTY ANTERIOR APPROACH;  Surgeon: Paralee Cancel, MD;  Location: [facility];  Service: Orthopedics;  Laterality: Right;   TOTAL HIP ARTHROPLASTY Left 11/30/2016   Procedure: LEFT TOTAL HIP ARTHROPLASTY ANTERIOR APPROACH;  Surgeon: Paralee Cancel, MD;  Location: [facility];  Service: Orthopedics;  Laterality: Left;  70 mins   Patient Active Problem List   Diagnosis Date Noted   S/P left THA, 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 Feels B UE paresthesia L > R. Keeps him awake at night. Pain began about a couple of years ago, gradual onset. Was laying on his R side a  couple of months ago and could not pick up his head. Has not happened since. Pt sleeps on his L side. Sleeping with his L arm to the side makes it numb. Only imaging is an x-ray. Gets pain in B upper trap pain for the past couple of months and also has posterior headaches. Had PT for low back before and L Achilles tendon tear which have improved. Also has B THA. L hand gets numb to the point where he cannot grip. Happens to R hand as well but not as often. Pt is mainly a side sleeper. Sleeps on the back at times. Pt is R hand dominant. Used to sleep on his L side more but not now. Pt sometimes feels off balance when he wakes up in the morning. 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  PRECAUTIONS: No known precautions  SUBJECTIVE: Neck is feeling fine. Numbness at just the tips of the fingers L hand.  Wants to try continuing with he HEP.      PAIN:  Are you having pain? See subjective. None, just stiffness.  TODAY'S TREATMENT:       Therapeutic exercise   Seated manually resisted scapular retraction targeting the lower trap muscles   R 10x3 with 5 second holds   L 10x3 with 5 second holds   Reviewed progress with PT towards goals  First rib stretch with strap with R and L cervical rotation    L 10x3   R 10x3  Seated thoracic extension over chair with chin tuck 10x5 seconds for 3 sets  Seated manually resisted scapular depression isometrics   L 10x5 seconds for 3 sets  R 10x5 seconds for 3 sets     Improved exercise technique, movement at target joints, use of target muscles after mod verbal, visual, tactile cues.     Manual therapy  Seated STM R and L Upper trap muscles to decrease tension      Response to treatment Pt tolerated session well without aggravation of symptoms.     Clinical impression    Pt demonstrates significant  decrease in neck pain, improved function, improved scapular strength and decreased L hand paresthesia since initial evaluation. Pt has made very good progress with PT towards goals. Skilled physical therapy services discharged with pt continuing progress with his exercises at home.           PATIENT EDUCATION: Education details: ther-ex, HEP Person educated: Patient Education method: Explanation, Demonstration, Tactile cues, Verbal cues, and Handouts Education comprehension: verbalized understanding and returned demonstration   HOME EXERCISE PROGRAM: Access Code: [email or web address] URL: [email or web address] Date: 10/12/2021 Prepared by: Joneen Boers  Exercises - Supine Deep Neck Flexor Nods  - 1 x daily - 7 x weekly - 3 sets - 10 reps - 5 seconds hold - Supine Shoulder Horizontal Abduction with Dumbbells  - 1 x daily - 7 x weekly - 3 sets - 10 reps - 5seconds hold  - Single Arm Shoulder Extension with Anchored Resistance  - 1 x daily - 7 x weekly - 3 sets - 10 reps - 5 seconds hold  - Single Arm Doorway Pec Stretch at 90 Degrees Abduction  - 3 x daily - 7 x weekly - 1 sets - 3 reps - 30 seconds hold - Supine Cervical Rotation AROM on Pillow  - 1 x daily - 7 x weekly - 3 sets - 10 reps - Seated Cervical Retraction  - 1 x daily - 7 x weekly - 3 sets - 10 reps - 5 seconds hold   L UE D1 flextion and extension 10x3.    PT Short Term Goals - 11/02/21 0849       PT SHORT TERM GOAL #1   Title Pt will be independent with his initial HEP to decrease pain, improve strength, function, and ability to sleep.    Baseline Pt has not yet started his HEP (10/08/2021); able to do his HEP, no questions (11/02/2021)    Time 3    Period Weeks    Status Achieved    Target Date 10/29/21              PT Long Term Goals - 11/26/21 0805       PT LONG TERM GOAL #1   Title Pt will have a decrease in neck pain to 2/10 or less at worst to promote ability to sleep/have a full night's  rest.    Baseline 7/10 neck pain at worst for the past 3 months (10/08/2021); 1/10 majority of the time (11/19/2021)    Time 8    Period Weeks  Status Achieved    Target Date 12/03/21      PT LONG TERM GOAL #2   Title Pt will report no L hand numbness to promote ability to sleep more comfortably.    Baseline 7/10 L hand numbness at most for the past 3 months (10/08/2021); L hand does not go numb as fast (11/19/2021); numbness at just the tips of the fingers, able to ignore it (11/26/2021)    Time 8    Period Weeks    Status Partially Met    Target Date 12/03/21      PT LONG TERM GOAL #3   Title Pt will improve bilateral lower trap strength to decrease upper trap tension on neck and promote ability to sleep and perform UE tasks more comfortably.    Baseline Seated manually resisted scapular retraction isometrics targeting lower trap: R 4-/5, L 4/5 secondary to pt unable to be on prone position (10/08/2021); R 4/5, L 4/5 (11/19/2021); R 4+/5, L 4+/5 (11/26/2021)    Time 8    Period Weeks    Status Achieved    Target Date 12/03/21      PT LONG TERM GOAL #4   Title Pt will improve his neck FOTO score by at least 10 points as a demonstration of improved function.    Baseline neck FOTO 72 (10/08/2021); 96 (11/19/2021)    Time 8    Period Weeks    Status Achieved    Target Date 12/03/21              Plan - 11/26/21 0758     Clinical Impression Statement Pt demonstrates significant decrease in neck pain, improved function, improved scapular strength and decreased L hand paresthesia since initial evaluation. Pt has made very good progress with PT towards goals. Skilled physical therapy services discharged with pt continuing progress with his exercises at home.    Personal Factors and Comorbidities Age;Comorbidity 3+;Fitness;Past/Current Experience;Time since onset of injury/illness/exacerbation    Comorbidities Arthritis, DM, HTN, dyspnea, hx of back surgery, B THA    Examination-Activity  Limitations Bed Mobility;Sleep    Stability/Clinical Decision Making Stable/Uncomplicated    Clinical Decision Making Low    Rehab Potential --    PT Frequency --    PT Duration --    PT Treatment/Interventions Therapeutic exercise;Therapeutic activities;Manual techniques;Neuromuscular re-education;Patient/family education   traction and/or manipulation if appropriate   PT Next Visit Plan Continue progress with HEP    PT Home Exercise Plan Medbridge Access Code: [email or web address]    Consulted and Agree with Plan of Care Patient                  Thank you for your referral.   Joneen Boers PT, DPT   11/26/2021, 8:54 AM

## 2022-03-10 LAB — COLOGUARD: COLOGUARD: NEGATIVE

## 2022-08-25 LAB — CBC AND DIFFERENTIAL
HCT: 45 (ref 41–53)
Hemoglobin: 14.6 (ref 13.5–17.5)
Platelets: 234 10*3/uL (ref 150–400)
WBC: 7.1

## 2022-08-25 LAB — BASIC METABOLIC PANEL
BUN: 11 (ref 4–21)
Chloride: 99 (ref 99–108)
Creatinine: 0.8 (ref 0.6–1.3)
Glucose: 134
Potassium: 4.4 meq/L (ref 3.5–5.1)
Sodium: 137 (ref 137–147)

## 2022-08-25 LAB — HEPATIC FUNCTION PANEL
ALT: 30 U/L (ref 10–40)
AST: 27 (ref 14–40)
Alkaline Phosphatase: 74 (ref 25–125)
Bilirubin, Total: 0.5

## 2022-08-25 LAB — COMPREHENSIVE METABOLIC PANEL
Albumin: 3.7 (ref 3.5–5.0)
Calcium: 9.2 (ref 8.7–10.7)
Globulin: 2.5
eGFR: 95

## 2022-08-25 LAB — LIPID PANEL
Cholesterol: 125 (ref 0–200)
HDL: 44 (ref 35–70)
LDL Cholesterol: 66
LDl/HDL Ratio: 2.8
Triglycerides: 75 (ref 40–160)

## 2022-08-25 LAB — CBC: RBC: 5.23 — AB (ref 3.87–5.11)

## 2022-08-25 LAB — HEMOGLOBIN A1C: Hemoglobin A1C: 6.8

## 2022-08-25 LAB — TSH: TSH: 3.45 (ref 0.41–5.90)

## 2022-08-30 LAB — COLOGUARD: Cologuard: NEGATIVE

## 2022-11-16 ENCOUNTER — Encounter: Payer: Self-pay | Admitting: Internal Medicine

## 2022-11-16 ENCOUNTER — Ambulatory Visit: Payer: Medicare PPO | Admitting: Internal Medicine

## 2022-11-16 VITALS — BP 142/68 | HR 70 | Temp 98.0°F | Resp 18 | Ht 71.0 in | Wt 279.6 lb

## 2022-11-16 DIAGNOSIS — Z794 Long term (current) use of insulin: Secondary | ICD-10-CM | POA: Diagnosis not present

## 2022-11-16 DIAGNOSIS — I10 Essential (primary) hypertension: Secondary | ICD-10-CM | POA: Diagnosis not present

## 2022-11-16 DIAGNOSIS — Z23 Encounter for immunization: Secondary | ICD-10-CM | POA: Diagnosis not present

## 2022-11-16 DIAGNOSIS — E1165 Type 2 diabetes mellitus with hyperglycemia: Secondary | ICD-10-CM | POA: Diagnosis not present

## 2022-11-16 LAB — POCT GLYCOSYLATED HEMOGLOBIN (HGB A1C): Hemoglobin A1C: 6.9 % — AB (ref 4.0–5.6)

## 2022-11-16 MED ORDER — TOUJEO MAX SOLOSTAR 300 UNIT/ML ~~LOC~~ SOPN: 70.0000 [IU] | PEN_INJECTOR | Freq: Every day | SUBCUTANEOUS | 1 refills | Status: DC

## 2022-11-16 MED ORDER — CLONIDINE HCL 0.3 MG PO TABS: 0.3000 mg | ORAL_TABLET | Freq: Every day | ORAL | 1 refills | Status: DC

## 2022-11-16 MED ORDER — DOXAZOSIN MESYLATE 8 MG PO TABS: 8.0000 mg | ORAL_TABLET | Freq: Every day | ORAL | 1 refills | Status: DC

## 2022-11-16 MED ORDER — SITAGLIPTIN PHOS-METFORMIN HCL 50-1000 MG PO TABS: 1.0000 | ORAL_TABLET | Freq: Two times a day (BID) | ORAL | 1 refills | Status: DC

## 2022-11-16 MED ORDER — LOSARTAN POTASSIUM-HCTZ 100-25 MG PO TABS: 1.0000 | ORAL_TABLET | Freq: Every day | ORAL | 1 refills | Status: DC

## 2022-11-16 MED ORDER — FIASP FLEXTOUCH 100 UNIT/ML ~~LOC~~ SOPN: PEN_INJECTOR | SUBCUTANEOUS | 1 refills | Status: DC

## 2022-11-16 MED ORDER — OZEMPIC (2 MG/DOSE) 8 MG/3ML ~~LOC~~ SOPN: 2.0000 mg | PEN_INJECTOR | SUBCUTANEOUS | 1 refills | Status: DC

## 2022-11-16 NOTE — Progress Notes (Signed)
New Patient Office Visit  Subjective    Patient ID: [name] [name], male    DOB: Mar 22, 1950  Age: 72 y.o. MRN: [id number]  CC:  Chief Complaint  Patient presents with   Establish Care    HPI [name] [name] presents to establish care.  Patient was a patient of Dr. Johny Chess, however he did not bring any of his records with him today and I am unable to view these records in the EMR.  Hypertension: -Medications: Clonidine 0.3 mg, Losartan-hydrochlorothiazide 100-25 mg, Doxazosin 8 mg -Patient is compliant with above medications and reports no side effects. -Checking BP at home (average): doesn't check at home  -Denies any SOB, CP, vision changes, LE edema or symptoms of hypotension  Diabetes, Type 2: -Last A1c 6.8% 7/24 per patient  -Medications: Janumet 50-1000 BID, Ozempic 2 mg weekly, Toujeo 70 units at lunchtime, Fiasp sliding scale at meals (breakfast 20-30 units, lunch 30 units on average and dinner 40-60 units). -Patient is compliant with the above medications and reports no side effects.  -Checking BG at home: checking 2-4 times a day - no hypoglycemic episodes  -Eye exam: UTD - May 2024 -Foot exam: Due -Microalbumin: Thinks he had earlier this year - will get records  -Statin: never -PNA vaccine: Uncertain  -Denies symptoms of hypoglycemia, polyuria, polydipsia, numbness extremities, foot ulcers/trauma.   Vitamin D Deficiency:  -Currently on Vitamin D 50,000 weekly   Health Maintenance: -Blood work UTD -Colonoscopy in [place] more than 10 years ago, had Cologuard that was negative uncertain when -Uncertain vaccine history - has had COVID vaccines except the new one -Flu vaccine today  Outpatient Encounter Medications as of 11/16/2022  Medication Sig   cloNIDine (CATAPRES) 0.3 MG tablet Take 0.3 mg by mouth daily.   doxazosin (CARDURA) 8 MG tablet Take 8 mg by mouth daily.   FIASP FLEXTOUCH 100 UNIT/ML FlexTouch Pen Inject into the skin.   losartan-hydrochlorothiazide  (HYZAAR) 100-25 MG tablet Take 1 tablet by mouth daily.    OZEMPIC, 2 MG/DOSE, 8 MG/3ML SOPN Inject 2 mg into the skin once a week.   sitaGLIPtin-metformin (JANUMET) 50-1000 MG tablet Take 1 tablet by mouth 2 (two) times daily with a meal.   TOUJEO MAX SOLOSTAR 300 UNIT/ML Solostar Pen 70 Units.   Vitamin D, Ergocalciferol, (DRISDOL) 50000 units CAPS capsule Take 50,000 Units by mouth every 7 (seven) days. sundays   [DISCONTINUED] Magnesium Oxide 500 MG CAPS Take 500 mg by mouth 2 (two) times daily.   [DISCONTINUED] docusate sodium (COLACE) 100 MG capsule Take 1 capsule (100 mg total) by mouth 2 (two) times daily.   [DISCONTINUED] exenatide (BYETTA) 10 MCG/0.04ML SOPN injection Inject 10 mcg into the skin 2 (two) times daily with a meal.   [DISCONTINUED] ferrous sulfate (FERROUSUL) 325 (65 FE) MG tablet Take 1 tablet (325 mg total) by mouth 3 (three) times daily with meals.   [DISCONTINUED] HYDROcodone-acetaminophen (NORCO) 7.5-325 MG tablet Take 1-2 tablets by mouth every 4 (four) hours as needed for moderate pain or severe pain.   [DISCONTINUED] insulin glargine (LANTUS) 100 UNIT/ML injection Inject 40 Units into the skin every evening.    [DISCONTINUED] insulin glulisine (APIDRA) 100 UNIT/ML injection Inject 20-60 Units into the skin 3 (three) times daily before meals. Based on sliding scale   [DISCONTINUED] methocarbamol (ROBAXIN) 500 MG tablet Take 1 tablet (500 mg total) by mouth every 6 (six) hours as needed for muscle spasms.   [DISCONTINUED] polyethylene glycol (MIRALAX / GLYCOLAX) packet Take 17  g by mouth 2 (two) times daily.   [DISCONTINUED] ranitidine (ZANTAC) 150 MG capsule Take 150 mg by mouth daily as needed for heartburn.   No facility-administered encounter medications on file as of 11/16/2022.    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/2016   Procedure: RIGHT TOTAL HIP ARTHROPLASTY ANTERIOR APPROACH;  Surgeon: Durene Romans, MD;  Location[facility];  Service: Orthopedics;  Laterality: Right;   TOTAL HIP ARTHROPLASTY Left 11/30/2016   Procedure: LEFT TOTAL HIP ARTHROPLASTY ANTERIOR APPROACH;  Surgeon: Durene Romans, MD;  Location: [facility];  Service: Orthopedics;  Laterality: Left;  70 mins    Family History  Problem Relation Age of Onset   Diabetes Mother    Dementia Mother     Social History   Socioeconomic History   Marital status: Married    Spouse name: Not on file   Number of children: Not on file   Years of education: Not on file   Highest education level: Not on file  Occupational History   Not on file  Tobacco Use   Smoking status: Never   Smokeless tobacco: Never  Vaping Use   Vaping status: Never Used  Substance and Sexual Activity   Alcohol use: No   Drug use: No   Sexual activity: Yes  Other Topics Concern   Not on file  Social History Narrative   Not on file   Social Determinants of Health   Financial Resource Strain: Not on file  Food Insecurity: Not on file  Transportation Needs: Not on file  Physical Activity: Not on file  Stress: Not on file  Social Connections: Not on file  Intimate Partner Violence: Not on file    Review of Systems  All other systems reviewed and are negative.       Objective    BP (!) 144/68   Pulse 70   Temp 98 F (36.7 C)   Resp 18   Ht 5\' 11"  (1.803 m)   Wt 279 lb 9.6 oz (126.8 kg)   SpO2 98%   BMI 39.00 kg/m   Physical Exam Constitutional:      Appearance: Normal appearance. He is obese.  HENT:     Head: Normocephalic and atraumatic.      Mouth/Throat:     Mouth: Mucous membranes are moist.     Pharynx: Oropharynx is clear.  Eyes:     Extraocular Movements: Extraocular movements intact.     Conjunctiva/sclera: Conjunctivae normal.     Pupils: Pupils are equal, round, and reactive to light.  Neck:     Vascular: No carotid bruit.  Cardiovascular:     Rate and Rhythm: Normal rate and regular rhythm.  Pulmonary:     Effort: Pulmonary effort is normal.     Breath sounds: Normal breath sounds.  Musculoskeletal:     Right lower leg:  Edema present.     Left lower leg: Edema present.  Skin:    General: Skin is warm and dry.  Neurological:     General: No focal deficit present.     Mental Status: He is alert. Mental status is at baseline.  Psychiatric:        Mood and Affect: Mood normal.        Behavior: Behavior normal.         Assessment & Plan:   1. Primary hypertension: Blood pressure slightly elevated today but has been stable.  No changes made to medications today and were refilled.  - cloNIDine (CATAPRES) 0.3 MG tablet; Take 1 tablet (0.3 mg total) by mouth daily.  Dispense: 90 tablet; Refill: 1 - losartan-hydrochlorothiazide (HYZAAR) 100-25 MG tablet; Take 1 tablet by mouth daily.  Dispense: 90 tablet; Refill: 1 - doxazosin (CARDURA) 8 MG tablet; Take 1 tablet (8 mg total) by mouth daily.  Dispense: 90 tablet; Refill: 1  2. Type 2 diabetes mellitus with hyperglycemia, with long-term current use of insulin (HCC): A1c stable at 6.9%.  Continue current medications, sent to pharmacy.  Patient did 1 discuss starting a medication for weight loss, had been on phentermine in the past.  Discussed I do not prescribe that medication due to potential side effects but if his insurance would cover [facility] that could potentially help with further appetite suppression and weight loss.  Patient will call his insurance company to see if this is something they will cover.  Continue the Ozempic for now.  - OZEMPIC, 2  MG/DOSE, 8 MG/3ML SOPN; Inject 2 mg into the skin once a week.  Dispense: 9 mL; Refill: 1 - sitaGLIPtin-metformin (JANUMET) 50-1000 MG tablet; Take 1 tablet by mouth 2 (two) times daily with a meal.  Dispense: 180 tablet; Refill: 1 - TOUJEO MAX SOLOSTAR 300 UNIT/ML Solostar Pen; Inject 70 Units into the skin daily.  Dispense: 7 mL; Refill: 1 - POCT HgB A1C - FIASP FLEXTOUCH 100 UNIT/ML FlexTouch Pen; Per sliding scale 120-139's 10 units,140-159's 20 units, >=160's 45 units.  Dispense: 108 mL; Refill: 1  3. Need for influenza vaccination: Flu vaccine administered today.   - Flu Vaccine Trivalent High Dose (Fluad)   Return in about 6 months (around 05/17/2023) for follow-up and another visit for AVW w/ nurse.   Margarita Mail, DO

## 2022-11-16 NOTE — Patient Instructions (Addendum)
It was great seeing you today!  Plan discussed at today's visit: -Please bring in all vaccine, labs and colon cancer screening records -Medications refilled except for Fiasp (short acting insulin) because I don't know exactly how much the sliding scale had been prescribed, please call pharmacy and let me know -Please call insurance to see if they will cover [employer] -Flu vaccine today  Follow up in: 6 months   Take care and let us know if you have any questions or concerns prior to your next visit.  Dr. Caralee Ates

## 2022-12-15 ENCOUNTER — Telehealth: Payer: Self-pay | Admitting: Internal Medicine

## 2022-12-15 NOTE — Telephone Encounter (Signed)
Medication Refill - Medication: Vitamin D, Ergocalciferol, (DRISDOL) 50000 units CAPS capsule , DB Ultra fine short pen needles 8 mm, ACCU-Chek test strips  Has the patient contacted their pharmacy? Yes.     Preferred Pharmacy (with phone number or street name):  [facility] - [place], [place] - [address] Phone: [phone]  Fax: [phone]      Has the patient been seen for an appointment in the last year OR does the patient have an upcoming appointment? Yes.    Please assist patient further as he received the needles and test strips from his previous provider who was not affiliated with [facility] so he needs new prescriptions for them

## 2022-12-23 ENCOUNTER — Other Ambulatory Visit: Payer: Self-pay

## 2022-12-24 ENCOUNTER — Other Ambulatory Visit: Payer: Self-pay | Admitting: Internal Medicine

## 2022-12-24 DIAGNOSIS — Z794 Long term (current) use of insulin: Secondary | ICD-10-CM

## 2022-12-24 MED ORDER — ACCU-CHEK GUIDE VI STRP: ORAL_STRIP | 12 refills | Status: DC

## 2022-12-24 MED ORDER — BD PEN NEEDLE MINI U/F 31G X 5 MM MISC: 0 refills | Status: DC

## 2022-12-24 NOTE — Telephone Encounter (Signed)
Melissa from [facility] called stated they need a new script for 90 days supply of TOUJEO MAX SOLOSTAR 300 UNIT/ML Solostar Pen . Please f/u with pharmacy for more info

## 2022-12-27 MED ORDER — TOUJEO MAX SOLOSTAR 300 UNIT/ML ~~LOC~~ SOPN: 70.0000 [IU] | PEN_INJECTOR | Freq: Every day | SUBCUTANEOUS | 1 refills | Status: DC

## 2022-12-27 NOTE — Telephone Encounter (Signed)
Requested medications are due for refill today.  yes  Requested medications are on the active medications list.  yes  Last refill. 11/22/2022 7mL 1 rf  Future visit scheduled.   yes  Notes to clinic.  Shamil Morayati listed as pcp.  Melissa from [facility] called stated they need a new script for 90 days supply of TOUJEO MAX SOLOSTAR 300 UNIT/ML Solostar Pen . Please f/u with pharmacy for more info    Requested Prescriptions  Pending Prescriptions Disp Refills   TOUJEO MAX SOLOSTAR 300 UNIT/ML Solostar Pen 7 mL 1    Sig: Inject 70 Units into the skin daily.     Endocrinology:  Diabetes - Insulins Passed - 12/24/2022  2:19 PM      Passed - HBA1C is between 0 and 7.9 and within 180 days    Hemoglobin A1C  Date Value Ref Range Status  11/16/2022 6.9 (A) 4.0 - 5.6 % Final  08/25/2022 6.8  Final         Passed - Valid encounter within last 6 months    Recent Outpatient Visits           1 month ago Primary hypertension   [facility] Margarita Mail, DO       Future Appointments             In 4 months Margarita Mail, DO [facility], [facility]

## 2023-02-24 ENCOUNTER — Other Ambulatory Visit: Payer: Self-pay | Admitting: Internal Medicine

## 2023-02-24 DIAGNOSIS — E559 Vitamin D deficiency, unspecified: Secondary | ICD-10-CM

## 2023-02-28 NOTE — Telephone Encounter (Signed)
 Requested medication (s) are due for refill today: unsure  Requested medication (s) are on the active medication list: yes  Last refill:  07/05/2016  Future visit scheduled: yes  Notes to clinic:  historical med, please review. Not delegated     Requested Prescriptions  Pending Prescriptions Disp Refills   Vitamin D , Ergocalciferol , (DRISDOL) 1.25 MG (50000 UNIT) CAPS capsule [Pharmacy Med Name: ergocalciferol  (vitamin D2) 1,250 mcg (50,000 unit) capsule] 13 capsule 0    Sig: TAKE ONE CAPSULE BY MOUTH WEEKLY     Endocrinology:  Vitamins - Vitamin D  Supplementation 2 Failed - 02/28/2023 11:01 AM      Failed - Manual Review: Route requests for 50,000 IU strength to the provider      Failed - Vitamin D  in normal range and within 360 days    No results found for: [id number], [id number], [id number], 25OHVITD3, 25OHVITD2, 25OHVITD1, VD25OH       Passed - Ca in normal range and within 360 days    Calcium  Date Value Ref Range Status  08/25/2022 9.2 8.7 - 10.7 Final   Calcium, Total  Date Value Ref Range Status  10/04/2012 8.2 (L) 8.5 - 10.1 mg/dL Final         Passed - Valid encounter within last 12 months    Recent Outpatient Visits           3 months ago Primary hypertension   [facility] Bernardo Fend, DO       Future Appointments             In 2 months Bernardo Fend, DO [facility], [facility]

## 2023-03-10 ENCOUNTER — Other Ambulatory Visit: Payer: Self-pay | Admitting: Internal Medicine

## 2023-03-10 DIAGNOSIS — E559 Vitamin D deficiency, unspecified: Secondary | ICD-10-CM

## 2023-03-10 NOTE — Telephone Encounter (Signed)
Requested medication (s) are due for refill today:   Not sure    It's listed as historical  Requested medication (s) are on the active medication list:   Yes but no information on it.  Future visit scheduled:   Yes 05/17/2023 with Dr. Caralee Ates   Last ordered: Historical  Returned for provider review    Requested Prescriptions  Pending Prescriptions Disp Refills   Vitamin D, Ergocalciferol, (DRISDOL) 1.25 MG (50000 UNIT) CAPS capsule [Pharmacy Med Name: ergocalciferol (vitamin D2) 1,250 mcg (50,000 unit) capsule] 13 capsule 0    Sig: TAKE ONE CAPSULE BY MOUTH WEEKLY     Endocrinology:  Vitamins - Vitamin D Supplementation 2 Failed - 03/10/2023  3:52 PM      Failed - Manual Review: Route requests for 50,000 IU strength to the provider      Failed - Vitamin D in normal range and within 360 days    No results found for: "GN5621HY8", "[id number]", "[id number]", "25OHVITD3", "25OHVITD2", "25OHVITD1", "VD25OH"       Passed - Ca in normal range and within 360 days    Calcium  Date Value Ref Range Status  08/25/2022 9.2 8.7 - 10.7 Final   Calcium, Total  Date Value Ref Range Status  10/04/2012 8.2 (L) 8.5 - 10.1 mg/dL Final         Passed - Valid encounter within last 12 months    Recent Outpatient Visits           3 months ago Primary hypertension   [facility] Margarita Mail, DO       Future Appointments             In 2 months Margarita Mail, DO [facility], [facility]

## 2023-03-14 ENCOUNTER — Other Ambulatory Visit: Payer: Self-pay | Admitting: Internal Medicine

## 2023-03-14 DIAGNOSIS — E559 Vitamin D deficiency, unspecified: Secondary | ICD-10-CM

## 2023-03-16 NOTE — Telephone Encounter (Signed)
Requested medication (s) are due for refill today - unknown  Requested medication (s) are on the active medication list -yes  Future visit scheduled -yes  Last refill: unknown  Notes to clinic: high dose medication- requires provider review   Requested Prescriptions  Pending Prescriptions Disp Refills   Vitamin D, Ergocalciferol, (DRISDOL) 1.25 MG (50000 UNIT) CAPS capsule [Pharmacy Med Name: ergocalciferol (vitamin D2) 1,250 mcg (50,000 unit) capsule] 13 capsule 0    Sig: TAKE ONE CAPSULE BY MOUTH WEEKLY     Endocrinology:  Vitamins - Vitamin D Supplementation 2 Failed - 03/16/2023  2:01 PM      Failed - Manual Review: Route requests for 50,000 IU strength to the provider      Failed - Vitamin D in normal range and within 360 days    No results found for: "ZO1096EA5", "WU9811BJ4", "[id number]", "25OHVITD3", "25OHVITD2", "25OHVITD1", "VD25OH"       Passed - Ca in normal range and within 360 days    Calcium  Date Value Ref Range Status  08/25/2022 9.2 8.7 - 10.7 Final   Calcium, Total  Date Value Ref Range Status  10/04/2012 8.2 (L) 8.5 - 10.1 mg/dL Final         Passed - Valid encounter within last 12 months    Recent Outpatient Visits           4 months ago Primary hypertension   [facility] Margarita Mail, DO       Future Appointments             In 2 months Margarita Mail, DO [facility], [facility]               Requested Prescriptions  Pending Prescriptions Disp Refills   Vitamin D, Ergocalciferol, (DRISDOL) 1.25 MG (50000 UNIT) CAPS capsule [Pharmacy Med Name: ergocalciferol (vitamin D2) 1,250 mcg (50,000 unit) capsule] 13 capsule 0    Sig: TAKE ONE CAPSULE BY MOUTH WEEKLY     Endocrinology:  Vitamins - Vitamin D Supplementation 2 Failed - 03/16/2023  2:01 PM      Failed - Manual Review: Route requests for 50,000 IU strength to the provider      Failed - Vitamin D in normal range and within 360  days    No results found for: "MV7846NG2", "XB2841LK4", "MW102VO5DGU", "25OHVITD3", "25OHVITD2", "25OHVITD1", "VD25OH"       Passed - Ca in normal range and within 360 days    Calcium  Date Value Ref Range Status  08/25/2022 9.2 8.7 - 10.7 Final   Calcium, Total  Date Value Ref Range Status  10/04/2012 8.2 (L) 8.5 - 10.1 mg/dL Final         Passed - Valid encounter within last 12 months    Recent Outpatient Visits           4 months ago Primary hypertension   [facility] Margarita Mail, DO       Future Appointments             In 2 months Margarita Mail, DO [facility], [facility]

## 2023-03-18 NOTE — Telephone Encounter (Signed)
I do not see a vitamin d lab on file. Sure the patient be taking otc vitamin d

## 2023-04-04 ENCOUNTER — Other Ambulatory Visit: Payer: Self-pay | Admitting: Internal Medicine

## 2023-04-05 NOTE — Telephone Encounter (Signed)
Requested Prescriptions  Pending Prescriptions Disp Refills   Insulin Pen Needle (B-D UF III MINI PEN NEEDLES) 31G X 5 MM MISC [Pharmacy Med Name: BD Ultra-Fine Mini Pen Needle 31 gauge x 3/16"] 200 each 0    Sig: AS DIRECTED FOUR TIMES DAILY     Endocrinology: Diabetes - Testing Supplies Passed - 04/05/2023  9:30 AM      Passed - Valid encounter within last 12 months    Recent Outpatient Visits           4 months ago Primary hypertension   [facility] Margarita Mail, DO       Future Appointments             In 1 month Margarita Mail, DO [facility], [facility]

## 2023-05-17 ENCOUNTER — Other Ambulatory Visit: Payer: Self-pay

## 2023-05-17 ENCOUNTER — Encounter: Payer: Self-pay | Admitting: Internal Medicine

## 2023-05-17 ENCOUNTER — Ambulatory Visit: Payer: Self-pay | Admitting: Internal Medicine

## 2023-05-17 VITALS — BP 128/76 | HR 74 | Temp 97.7°F | Resp 16 | Ht 71.0 in | Wt 281.6 lb

## 2023-05-17 DIAGNOSIS — Z13220 Encounter for screening for lipoid disorders: Secondary | ICD-10-CM | POA: Diagnosis not present

## 2023-05-17 DIAGNOSIS — E1165 Type 2 diabetes mellitus with hyperglycemia: Secondary | ICD-10-CM

## 2023-05-17 DIAGNOSIS — E559 Vitamin D deficiency, unspecified: Secondary | ICD-10-CM | POA: Diagnosis not present

## 2023-05-17 DIAGNOSIS — I10 Essential (primary) hypertension: Secondary | ICD-10-CM | POA: Insufficient documentation

## 2023-05-17 DIAGNOSIS — Z794 Long term (current) use of insulin: Secondary | ICD-10-CM | POA: Insufficient documentation

## 2023-05-17 MED ORDER — TOUJEO MAX SOLOSTAR 300 UNIT/ML ~~LOC~~ SOPN: 70.0000 [IU] | PEN_INJECTOR | Freq: Every day | SUBCUTANEOUS | 1 refills | Status: DC

## 2023-05-17 MED ORDER — SITAGLIPTIN PHOS-METFORMIN HCL 50-1000 MG PO TABS: 1.0000 | ORAL_TABLET | Freq: Two times a day (BID) | ORAL | 1 refills | Status: DC

## 2023-05-17 MED ORDER — LOSARTAN POTASSIUM-HCTZ 100-25 MG PO TABS: 1.0000 | ORAL_TABLET | Freq: Every day | ORAL | 1 refills | Status: DC

## 2023-05-17 MED ORDER — FIASP FLEXTOUCH 100 UNIT/ML ~~LOC~~ SOPN: PEN_INJECTOR | SUBCUTANEOUS | 1 refills | Status: DC

## 2023-05-17 MED ORDER — DOXAZOSIN MESYLATE 8 MG PO TABS: 8.0000 mg | ORAL_TABLET | Freq: Every day | ORAL | 1 refills | Status: DC

## 2023-05-17 MED ORDER — CLONIDINE HCL 0.3 MG PO TABS: 0.3000 mg | ORAL_TABLET | Freq: Every day | ORAL | 1 refills | Status: DC

## 2023-05-17 NOTE — Progress Notes (Signed)
 Established Patient Office Visit  Subjective    Patient ID: [name] [name], male    DOB: Mar 04, 1950  Age: 73 y.o. MRN: [id number]  CC:  Chief Complaint  Patient presents with   Medical Management of Chronic Issues    6 month recheck    HPI TANK DIFIORE presents to follow up on chronic medication conditions.   Hypertension: -Medications: Clonidine 0.3 mg, Losartan-hydrochlorothiazide 100-25 mg, Doxazosin 8 mg -Patient is compliant with above medications and reports no side effects. -Checking BP at home (average): doesn't check at home  -Denies any SOB, CP, vision changes, LE edema or symptoms of hypotension  Diabetes, Type 2: -Last A1c 6.9% 10/24 -Medications: Janumet 50-1000 BID, Ozempic 2 mg weekly, Toujeo 70 units at lunchtime, Fiasp sliding scale at meals (breakfast 20-30 units, lunch 30 units on average and dinner 40-60 units). -Patient is compliant with the above medications and reports no side effects.  -Checking BG at home: average 67-188 -Eye exam: UTD - May 2024 -Foot exam: Due -Microalbumin: Thinks he had earlier this year - will get records or  -Statin: never -PNA vaccine: Discussed, willing to get booster but let patient leave without getting it today, will get it next time -Denies symptoms of hypoglycemia, polyuria, polydipsia, numbness extremities, foot ulcers/trauma.   Vitamin D Deficiency:  -Currently on Vitamin D 50,000 weekly   Health Maintenance: -Blood work UTD -Colonoscopy in [place] more than 10 years ago, had Cologuard that was negative uncertain when -Uncertain vaccine history - has had COVID vaccines except the new one -Flu vaccine today  Outpatient Encounter Medications as of 05/17/2023  Medication Sig   OZEMPIC, 2 MG/DOSE, 8 MG/3ML SOPN Inject 2 mg into the skin once a week.   Vitamin D, Ergocalciferol, (DRISDOL) 50000 units CAPS capsule Take 50,000 Units by mouth every 7 (seven) days. sundays   [DISCONTINUED] cloNIDine (CATAPRES) 0.3 MG tablet  Take 1 tablet (0.3 mg total) by mouth daily.   [DISCONTINUED] doxazosin (CARDURA) 8 MG tablet Take 1 tablet (8 mg total) by mouth daily.   [DISCONTINUED] FIASP FLEXTOUCH 100 UNIT/ML FlexTouch Pen Per sliding scale 120-139's 10 units,140-159's 20 units, >=160's 45 units.   [DISCONTINUED] losartan-hydrochlorothiazide (HYZAAR) 100-25 MG tablet Take 1 tablet by mouth daily.   [DISCONTINUED] sitaGLIPtin-metformin (JANUMET) 50-1000 MG tablet Take 1 tablet by mouth 2 (two) times daily with a meal.   [DISCONTINUED] TOUJEO MAX SOLOSTAR 300 UNIT/ML Solostar Pen Inject 70 Units into the skin daily.   cloNIDine (CATAPRES) 0.3 MG tablet Take 1 tablet (0.3 mg total) by mouth daily.   doxazosin (CARDURA) 8 MG tablet Take 1 tablet (8 mg total) by mouth daily.   FIASP FLEXTOUCH 100 UNIT/ML FlexTouch Pen Per sliding scale 120-139's 10 units,140-159's 20 units, >=160's 45 units.   glucose blood (ACCU-CHEK GUIDE) test strip Use as instructed (Patient not taking: Reported on 05/17/2023)   Insulin Pen Needle (B-D UF III MINI PEN NEEDLES) 31G X 5 MM MISC AS DIRECTED FOUR TIMES DAILY (Patient not taking: Reported on 05/17/2023)   losartan-hydrochlorothiazide (HYZAAR) 100-25 MG tablet Take 1 tablet by mouth daily.   sitaGLIPtin-metformin (JANUMET) 50-1000 MG tablet Take 1 tablet by mouth 2 (two) times daily with a meal.   TOUJEO MAX SOLOSTAR 300 UNIT/ML Solostar Pen Inject 70 Units into the skin daily.   No facility-administered encounter medications on file as of 05/17/2023.    Past Medical History:  Diagnosis Date   Arthritis    Diabetes mellitus without complication (HCC)  type 2   Dyspnea    with excertion   Headache    rare   Hypertension    Pneumonia    as a child    Past Surgical History:  Procedure Laterality Date   BACK SURGERY     2005   cartarized     bleeding ulcer  2015   JOINT REPLACEMENT     Right total hip 5/29 2018   REPAIR QUADRICEPS / HAMSTRING MUSCLE     torn loose from knee 2009 5/25    TOTAL HIP ARTHROPLASTY Right 07/13/2016   Procedure: RIGHT TOTAL HIP ARTHROPLASTY ANTERIOR APPROACH;  Surgeon: Durene Romans, MD;  Location: [facility];  Service: Orthopedics;  Laterality: Right;   TOTAL HIP ARTHROPLASTY Left 11/30/2016   Procedure: LEFT TOTAL HIP ARTHROPLASTY ANTERIOR APPROACH;  Surgeon: Durene Romans, MD;  Location: [facility];  Service: Orthopedics;  Laterality: Left;  70 mins    Family History  Problem Relation Age of Onset   Diabetes Mother    Dementia Mother     Social History   Socioeconomic History   Marital status: Married    Spouse name: Not on file   Number of children: Not on file   Years of education: Not on file   Highest education level: Not on file  Occupational History   Not on file  Tobacco Use   Smoking status: Never   Smokeless tobacco: Never  Vaping Use   Vaping status: Never Used  Substance and Sexual Activity   Alcohol use: No   Drug use: No   Sexual activity: Yes  Other Topics Concern   Not on file  Social History Narrative   Not on file   Social Drivers of Health   Financial Resource Strain: Not on file  Food Insecurity: Not on file  Transportation Needs: Not on file  Physical Activity: Not on file  Stress: Not on file  Social Connections: Not on file  Intimate Partner Violence: Not on file    Review of Systems  All other systems reviewed and are negative.       Objective    BP 128/76 (Cuff Size: Large)   Pulse 74   Temp 97.7 F (36.5 C) (Oral)   Resp 16   Ht 5\' 11"  (1.803 m)   Wt 281 lb 9.6 oz (127.7 kg)   SpO2 96%   BMI 39.28 kg/m   Physical Exam Constitutional:      Appearance: Normal appearance. He is obese.  HENT:     Head: Normocephalic and atraumatic.  Eyes:     Conjunctiva/sclera: Conjunctivae normal.  Cardiovascular:     Rate and Rhythm: Normal rate and regular rhythm.     Pulses:          Dorsalis pedis pulses are 2+ on the right side and 2+ on the left side.  Pulmonary:     Effort: Pulmonary  effort is normal.     Breath sounds: Normal breath sounds.  Musculoskeletal:     Right foot: Normal range of motion. No deformity, bunion, Charcot foot, foot drop or prominent metatarsal heads.     Left foot: Normal range of motion. No deformity, bunion, Charcot foot, foot drop or prominent metatarsal heads.  Feet:     Right foot:     Protective Sensation: 6 sites tested.  6 sites sensed.     Skin integrity: Skin integrity normal.     Toenail Condition: Right toenails are normal.     Left foot:  Protective Sensation: 6 sites tested.  6 sites sensed.     Skin integrity: Skin integrity normal.     Toenail Condition: Left toenails are normal.  Skin:    General: Skin is warm and dry.  Neurological:     General: No focal deficit present.     Mental Status: He is alert. Mental status is at baseline.  Psychiatric:        Mood and Affect: Mood normal.        Behavior: Behavior normal.         Assessment & Plan:   Type 2 diabetes mellitus with hyperglycemia, with long-term current use of insulin (HCC) Assessment & Plan: Will return for fasting labs, recheck A1c at that time. No changes to medications, discussed switching from Ozempic to [facility] to help with weight loss. He will use what he has left of the Ozempic and when he has 2 doses left will message or call and I will prescribe Mounjaro. Foot exam today.   Orders: -     CBC with Differential/Platelet -     COMPLETE METABOLIC PANEL WITHOUT GFR -     Hemoglobin A1c -     Toujeo Max SoloStar; Inject 70 Units into the skin daily.  Dispense: 7 mL; Refill: 1 -     Fiasp FlexTouch; Per sliding scale 120-139's 10 units,140-159's 20 units, >=160's 45 units.  Dispense: 108 mL; Refill: 1 -     SITagliptin-metFORMIN HCl; Take 1 tablet by mouth 2 (two) times daily with a meal.  Dispense: 180 tablet; Refill: 1  Primary hypertension Assessment & Plan: Blood pressure stable here today, no changes made to medications and appropriate  refills sent to pharmacy.    Orders: -     Doxazosin Mesylate; Take 1 tablet (8 mg total) by mouth daily.  Dispense: 90 tablet; Refill: 1 -     cloNIDine HCl; Take 1 tablet (0.3 mg total) by mouth daily.  Dispense: 90 tablet; Refill: 1 -     Losartan Potassium-HCTZ; Take 1 tablet by mouth daily.  Dispense: 90 tablet; Refill: 1  Lipid screening -     Lipid panel  Vitamin D deficiency -     VITAMIN D 25 Hydroxy (Vit-D Deficiency, Fractures)     Return in about 6 months (around 11/16/2023).   Margarita Mail, DO

## 2023-05-17 NOTE — Assessment & Plan Note (Signed)
 Blood pressure stable here today, no changes made to medications and appropriate refills sent to pharmacy.

## 2023-05-17 NOTE — Patient Instructions (Addendum)
 It was great seeing you today!  Plan discussed at today's visit: -Blood work ordered today, results will be uploaded to MyChart. Please return fasting any week day from 8-11:30 or 1:30-3:30 -When you have 2-3 doses of Ozempic left, please call me and I will prescribe Mounjaro to use weekly   Follow up in: 6 months but will reschedule after switching to [facility]  Take care and let us know if you have any questions or concerns prior to your next visit.  Dr. Caralee Ates

## 2023-05-17 NOTE — Assessment & Plan Note (Signed)
 Will return for fasting labs, recheck A1c at that time. No changes to medications, discussed switching from Ozempic to [facility] to help with weight loss. He will use what he has left of the Ozempic and when he has 2 doses left will message or call and I will prescribe Mounjaro. Foot exam today.

## 2023-06-02 ENCOUNTER — Other Ambulatory Visit: Payer: Self-pay | Admitting: Internal Medicine

## 2023-06-02 DIAGNOSIS — E559 Vitamin D deficiency, unspecified: Secondary | ICD-10-CM

## 2023-06-03 NOTE — Telephone Encounter (Signed)
 Requested medications are due for refill today.  unsure  Requested medications are on the active medications list.  yes  Last refill. 07/05/2016   Future visit scheduled.   yes  Notes to clinic.  Medication is historical. Provider to review for this dosage.    Requested Prescriptions  Pending Prescriptions Disp Refills   Vitamin D, Ergocalciferol, (DRISDOL) 1.25 MG (50000 UNIT) CAPS capsule [Pharmacy Med Name: ergocalciferol (vitamin D2) 1,250 mcg (50,000 unit) capsule] 13 capsule 0    Sig: TAKE ONE CAPSULE BY MOUTH ONCE WEEKLY     Endocrinology:  Vitamins - Vitamin D Supplementation 2 Failed - 06/03/2023  9:22 AM      Failed - Manual Review: Route requests for 50,000 IU strength to the provider      Failed - Vitamin D in normal range and within 360 days    No results found for: "ZO1096EA5", "WU9811BJ4", "[id number]", "25OHVITD3", "25OHVITD2", "25OHVITD1", "VD25OH"       Passed - Ca in normal range and within 360 days    Calcium  Date Value Ref Range Status  08/25/2022 9.2 8.7 - 10.7 Final   Calcium, Total  Date Value Ref Range Status  10/04/2012 8.2 (L) 8.5 - 10.1 mg/dL Final         Passed - Valid encounter within last 12 months    Recent Outpatient Visits           2 weeks ago Type 2 diabetes mellitus with hyperglycemia, with long-term current use of insulin  [facility])   [facility] [place], [place]

## 2023-06-08 ENCOUNTER — Other Ambulatory Visit: Payer: Self-pay | Admitting: Internal Medicine

## 2023-06-08 DIAGNOSIS — Z794 Long term (current) use of insulin: Secondary | ICD-10-CM

## 2023-06-08 NOTE — Telephone Encounter (Signed)
 Requested medication (s) are due for refill today: yes  Requested medication (s) are on the active medication list: yes  Last refill:  11/16/22 #9/1  Future visit scheduled: yes  Notes to clinic:  Unable to refill per protocol due to failed labs, no updated results.      Requested Prescriptions  Pending Prescriptions Disp Refills   OZEMPIC , 2 MG/DOSE, 8 MG/3ML SOPN [Pharmacy Med Name: Ozempic  2 mg/dose (8 mg/3 mL) subcutaneous pen injector] 9 mL 1    Sig: INJECT 2MG  SUBCUTANEOUSLY WEEKLY     Endocrinology:  Diabetes - GLP-1 Receptor Agonists - semaglutide  Failed - 06/08/2023  5:53 PM      Failed - HBA1C in normal range and within 180 days    Hemoglobin A1C  Date Value Ref Range Status  11/16/2022 6.9 (A) 4.0 - 5.6 % Final  08/25/2022 6.8  Final         Passed - Cr in normal range and within 360 days    Creatinine  Date Value Ref Range Status  08/25/2022 0.8 0.6 - 1.3 Final  10/04/2012 0.95 0.60 - 1.30 mg/dL Final   Creatinine, Ser  Date Value Ref Range Status  12/01/2016 0.84 0.61 - 1.24 mg/dL Final         Passed - Valid encounter within last 6 months    Recent Outpatient Visits           3 weeks ago Type 2 diabetes mellitus with hyperglycemia, with long-term current use of insulin  [facility])   [facility] [place], [place]

## 2023-06-09 ENCOUNTER — Other Ambulatory Visit: Payer: Self-pay | Admitting: Internal Medicine

## 2023-06-09 DIAGNOSIS — E559 Vitamin D deficiency, unspecified: Secondary | ICD-10-CM

## 2023-06-10 NOTE — Telephone Encounter (Signed)
 Requested medication (s) are due for refill today: routing for review  Requested medication (s) are on the active medication list: yes  Last refill:  07/05/16  Future visit scheduled: yes  Notes to clinic:  Unable to refill per protocol, last refill by another/historical provider.      Requested Prescriptions  Pending Prescriptions Disp Refills   Vitamin D, Ergocalciferol, (DRISDOL) 1.25 MG (50000 UNIT) CAPS capsule [Pharmacy Med Name: ergocalciferol (vitamin D2) 1,250 mcg (50,000 unit) capsule] 13 capsule 0    Sig: TAKE ONE CAPSULE BY MOUTH ONCE WEEKLY     Endocrinology:  Vitamins - Vitamin D Supplementation 2 Failed - 06/10/2023  1:11 PM      Failed - Manual Review: Route requests for 50,000 IU strength to the provider      Failed - Vitamin D in normal range and within 360 days    No results found for: "[id number]", "QM5784ON6", "[id number]", "25OHVITD3", "25OHVITD2", "25OHVITD1", "VD25OH"       Passed - Ca in normal range and within 360 days    Calcium  Date Value Ref Range Status  08/25/2022 9.2 8.7 - 10.7 Final   Calcium, Total  Date Value Ref Range Status  10/04/2012 8.2 (L) 8.5 - 10.1 mg/dL Final         Passed - Valid encounter within last 12 months    Recent Outpatient Visits           3 weeks ago Type 2 diabetes mellitus with hyperglycemia, with long-term current use of insulin  [facility])   [facility] [place], [place]

## 2023-06-14 ENCOUNTER — Telehealth: Payer: Self-pay

## 2023-06-14 NOTE — Telephone Encounter (Signed)
 Will wait on labs to discuss further

## 2023-06-14 NOTE — Telephone Encounter (Signed)
 Pt states he was advised to let Dr Bud Care know when he is down to 2 of the OZEMPIC , 2 MG/DOSE, 8 MG/3ML SOPN . Pt came in for labs today.

## 2023-06-15 LAB — CBC WITH DIFFERENTIAL/PLATELET
Absolute Lymphocytes: 1537 {cells}/uL (ref 850–3900)
Absolute Monocytes: 632 {cells}/uL (ref 200–950)
Basophils Absolute: 31 {cells}/uL (ref 0–200)
Basophils Relative: 0.4 %
Eosinophils Absolute: 109 {cells}/uL (ref 15–500)
Eosinophils Relative: 1.4 %
HCT: 45 % (ref 38.5–50.0)
Hemoglobin: 14.6 g/dL (ref 13.2–17.1)
MCH: 28.1 pg (ref 27.0–33.0)
MCHC: 32.4 g/dL (ref 32.0–36.0)
MCV: 86.5 fL (ref 80.0–100.0)
MPV: 10.4 fL (ref 7.5–12.5)
Monocytes Relative: 8.1 %
Neutro Abs: 5491 {cells}/uL (ref 1500–7800)
Neutrophils Relative %: 70.4 %
Platelets: 256 10*3/uL (ref 140–400)
RBC: 5.2 10*6/uL (ref 4.20–5.80)
RDW: 13.3 % (ref 11.0–15.0)
Total Lymphocyte: 19.7 %
WBC: 7.8 10*3/uL (ref 3.8–10.8)

## 2023-06-15 LAB — COMPLETE METABOLIC PANEL WITHOUT GFR
AG Ratio: 1.6 (calc) (ref 1.0–2.5)
ALT: 35 U/L (ref 9–46)
AST: 31 U/L (ref 10–35)
Albumin: 4.1 g/dL (ref 3.6–5.1)
Alkaline phosphatase (APISO): 62 U/L (ref 35–144)
BUN: 10 mg/dL (ref 7–25)
CO2: 31 mmol/L (ref 20–32)
Calcium: 9.5 mg/dL (ref 8.6–10.3)
Chloride: 100 mmol/L (ref 98–110)
Creat: 0.86 mg/dL (ref 0.70–1.28)
Globulin: 2.6 g/dL (ref 1.9–3.7)
Glucose, Bld: 111 mg/dL — ABNORMAL HIGH (ref 65–99)
Potassium: 4.6 mmol/L (ref 3.5–5.3)
Sodium: 138 mmol/L (ref 135–146)
Total Bilirubin: 0.5 mg/dL (ref 0.2–1.2)
Total Protein: 6.7 g/dL (ref 6.1–8.1)

## 2023-06-15 LAB — LIPID PANEL
Cholesterol: 138 mg/dL (ref <200)
HDL: 44 mg/dL (ref >=40)
LDL Cholesterol (Calc): 78 mg/dL
Non-HDL Cholesterol (Calc): 94 mg/dL (ref <130)
Total CHOL/HDL Ratio: 3.1 (calc) (ref <5.0)
Triglycerides: 78 mg/dL (ref <150)

## 2023-06-15 LAB — VITAMIN D 25 HYDROXY (VIT D DEFICIENCY, FRACTURES): Vit D, 25-Hydroxy: 141 ng/mL — ABNORMAL HIGH (ref 30–100)

## 2023-06-15 LAB — HEMOGLOBIN A1C
Hgb A1c MFr Bld: 7 % — ABNORMAL HIGH (ref <5.7)
Mean Plasma Glucose: 154 mg/dL
eAG (mmol/L): 8.5 mmol/L

## 2023-06-16 ENCOUNTER — Encounter: Payer: Self-pay | Admitting: Internal Medicine

## 2023-06-20 ENCOUNTER — Other Ambulatory Visit: Payer: Self-pay | Admitting: Internal Medicine

## 2023-06-20 ENCOUNTER — Telehealth: Payer: Self-pay

## 2023-06-20 DIAGNOSIS — E1165 Type 2 diabetes mellitus with hyperglycemia: Secondary | ICD-10-CM

## 2023-06-20 MED ORDER — MOUNJARO 10 MG/0.5ML ~~LOC~~ SOAJ: 10.0000 mg | SUBCUTANEOUS | 0 refills | Status: DC

## 2023-06-20 NOTE — Telephone Encounter (Signed)
 Left detailed vm

## 2023-06-20 NOTE — Telephone Encounter (Signed)
 Copied from CRM [id number]. Topic: Clinical - Prescription Issue >> Jun 20, 2023  9:47 AM Donald Frost wrote: Reason for CRM: The patient states a few weeks back he spoke with his provider and was going to be switched from Ozempic  to Mounjaro which it states in the notes from last visit. He states the pharmacy still has not gotten the script for the Mounjaro and he will be taking his last injection on this upcoming Sunday 5/11. He states he had blood work done last week and told the front desk he had just 2 of his Ozempic  injections left at the time. Please assist patient further by sending new [facility] script to  [facility] [place] - [place], [place] - [address]  Phone: [phone] Fax: [phone]

## 2023-06-21 ENCOUNTER — Telehealth: Payer: Self-pay | Admitting: Pharmacy Technician

## 2023-06-21 ENCOUNTER — Other Ambulatory Visit (HOSPITAL_COMMUNITY): Payer: Self-pay

## 2023-06-21 NOTE — Telephone Encounter (Signed)
 Ok thanks will submit PA

## 2023-06-21 NOTE — Telephone Encounter (Signed)
 Pharmacy Patient Advocate Encounter   Received notification from Onbase that prior authorization for Mounjaro 10MG /0.5ML auto-injectors is required/requested.   Insurance verification completed.   The patient is insured through [employer] .   Per test claim: PA required; PA submitted to above mentioned insurance via CoverMyMeds Key/confirmation #/EOC [id number] Status is pending

## 2023-06-21 NOTE — Telephone Encounter (Signed)
 Prior Authorization form/request asks a question that requires your assistance. Please see the question below and advise accordingly. The PA will not be submitted until the necessary information is received.      WILL [name] [name], STAY ON JANUMET  AS WELL AS MOUNJARO?

## 2023-06-22 ENCOUNTER — Other Ambulatory Visit (HOSPITAL_COMMUNITY): Payer: Self-pay

## 2023-06-22 NOTE — Telephone Encounter (Signed)
 Pharmacy Patient Advocate Encounter  Received notification from [employer] that Prior Authorization for Mounjaro 10MG /0.5ML auto-injectors has been APPROVED from 02/16/23 to 02/15/24. Ran test claim, Copay is $80.00 for 3 months. This test claim was processed through [facility]- copay amounts may vary at other pharmacies due to pharmacy/plan contracts, or as the patient moves through the different stages of their insurance plan.   PA #/Case ID/Reference #: [id number]

## 2023-07-25 ENCOUNTER — Telehealth: Payer: Self-pay | Admitting: Internal Medicine

## 2023-07-25 NOTE — Telephone Encounter (Signed)
 Copied from CRM [id number]. Topic: General - Other >> Jul 25, 2023  3:03 PM Turkey B wrote: Reason for CRM: Tillman Folks from [facility] called in to see if fax has been received on June 7 for drug therapy. Please respond soon, so case been closed

## 2023-07-27 NOTE — Telephone Encounter (Signed)
 Spoke to [facility] and informed them to refax paperwork. We did not recive

## 2023-09-14 ENCOUNTER — Other Ambulatory Visit: Payer: Self-pay | Admitting: Internal Medicine

## 2023-09-14 DIAGNOSIS — E1165 Type 2 diabetes mellitus with hyperglycemia: Secondary | ICD-10-CM

## 2023-09-15 NOTE — Telephone Encounter (Signed)
 Requested medications are due for refill today.  yes  Requested medications are on the active medications list.  yes  Last refill. 06/20/2023 6mL 0 rf  Future visit scheduled.   yes  Notes to clinic.  Medication not assigned to a protocol. Please review for refill.    Requested Prescriptions  Pending Prescriptions Disp Refills   MOUNJARO  10 MG/0.5ML Pen [Pharmacy Med Name: Mounjaro  10 mg/0.5 mL subcutaneous pen injector] 6 mL 0    Sig: INJECT 10MG  INTO THE SKIN ONCE A WEEK     Off-Protocol Failed - 09/15/2023 12:26 PM      Failed - Medication not assigned to a protocol, review manually.      Passed - Valid encounter within last 12 months    Recent Outpatient Visits           4 months ago Type 2 diabetes mellitus with hyperglycemia, with long-term current use of insulin  [facility])   [facility] [place], [place]

## 2023-10-07 ENCOUNTER — Other Ambulatory Visit: Payer: Self-pay | Admitting: Internal Medicine

## 2023-10-07 DIAGNOSIS — E1165 Type 2 diabetes mellitus with hyperglycemia: Secondary | ICD-10-CM

## 2023-10-07 MED ORDER — TIRZEPATIDE 12.5 MG/0.5ML ~~LOC~~ SOAJ: 12.5000 mg | SUBCUTANEOUS | 0 refills | Status: DC

## 2023-11-14 NOTE — Progress Notes (Signed)
 [name] [name]                                          MRN: [id number]   11/14/2023   The [facility]BCI Quality Team Specialist reviewed this patient medical record for the purposes of chart review for care gap closure. The following were reviewed: chart review for care gap closure-kidney health evaluation for diabetes:eGFR  and uACR.    VBCI Quality Team

## 2023-11-21 ENCOUNTER — Other Ambulatory Visit: Payer: Self-pay

## 2023-11-21 ENCOUNTER — Encounter: Payer: Self-pay | Admitting: Internal Medicine

## 2023-11-21 ENCOUNTER — Ambulatory Visit: Admitting: Internal Medicine

## 2023-11-21 VITALS — BP 130/80 | HR 66 | Temp 97.6°F | Resp 16 | Ht 71.0 in | Wt 279.7 lb

## 2023-11-21 DIAGNOSIS — E1165 Type 2 diabetes mellitus with hyperglycemia: Secondary | ICD-10-CM

## 2023-11-21 DIAGNOSIS — S41111A Laceration without foreign body of right upper arm, initial encounter: Secondary | ICD-10-CM

## 2023-11-21 DIAGNOSIS — Z23 Encounter for immunization: Secondary | ICD-10-CM

## 2023-11-21 DIAGNOSIS — I10 Essential (primary) hypertension: Secondary | ICD-10-CM

## 2023-11-21 DIAGNOSIS — Z136 Encounter for screening for cardiovascular disorders: Secondary | ICD-10-CM

## 2023-11-21 DIAGNOSIS — Z794 Long term (current) use of insulin: Secondary | ICD-10-CM

## 2023-11-21 LAB — POCT GLYCOSYLATED HEMOGLOBIN (HGB A1C): Hemoglobin A1C: 6.3 % — AB (ref 4.0–5.6)

## 2023-11-21 MED ORDER — LOSARTAN POTASSIUM-HCTZ 100-25 MG PO TABS: 1.0000 | ORAL_TABLET | Freq: Every day | ORAL | 1 refills | Status: AC

## 2023-11-21 MED ORDER — FIASP FLEXTOUCH 100 UNIT/ML ~~LOC~~ SOPN: PEN_INJECTOR | SUBCUTANEOUS | 1 refills | Status: AC

## 2023-11-21 MED ORDER — DOXAZOSIN MESYLATE 8 MG PO TABS: 8.0000 mg | ORAL_TABLET | Freq: Every day | ORAL | 1 refills | Status: AC

## 2023-11-21 MED ORDER — TOUJEO MAX SOLOSTAR 300 UNIT/ML ~~LOC~~ SOPN: 70.0000 [IU] | PEN_INJECTOR | Freq: Every day | SUBCUTANEOUS | 1 refills | Status: DC

## 2023-11-21 MED ORDER — SITAGLIPTIN PHOS-METFORMIN HCL 50-1000 MG PO TABS: 1.0000 | ORAL_TABLET | Freq: Two times a day (BID) | ORAL | 1 refills | Status: AC

## 2023-11-21 MED ORDER — TIRZEPATIDE 15 MG/0.5ML ~~LOC~~ SOAJ: 15.0000 mg | SUBCUTANEOUS | 1 refills | Status: AC

## 2023-11-21 MED ORDER — CLONIDINE HCL 0.3 MG PO TABS: 0.3000 mg | ORAL_TABLET | Freq: Every day | ORAL | 1 refills | Status: AC

## 2023-11-21 NOTE — Progress Notes (Signed)
 [name] [name]                                          MRN: [id number]   11/21/2023   The VBCI Quality Team Specialist reviewed this patient medical record for the purposes of chart review for care gap closure. The following were reviewed: abstraction for care gap closure-controlling blood pressure.    VBCI Quality Team

## 2023-11-21 NOTE — Progress Notes (Addendum)
 Established Patient Office Visit  Subjective    Patient ID: [name] [name], male    DOB: 1950-05-05  Age: 73 y.o. MRN: [id number]  CC:  Chief Complaint  Patient presents with   Medical Management of Chronic Issues    6 month recheck    HPI [name] [name] presents to follow up on chronic medication conditions.   Discussed the use of AI scribe software for clinical note transcription with the patient, who gave verbal consent to proceed.  History of Present Illness [name] [name] is a 73 year old male who presents for a follow-up visit regarding his diabetes management and vaccination updates.  He is on Mounjaro  12.5 mg for diabetes management, with stable blood sugars but no weight loss. He has been on this dose for six weeks without gastrointestinal side effects. His A1c improved from 7.0 in April to 6.3 recently. He uses Toujeo  insulin , taking 70 units, with a sliding scale for meals: 20-30 units at breakfast, 30 units at lunch, and 40-60 units at dinner. He monitors blood sugar twice daily.  He received a flu vaccine recently and a pneumonia vaccine in 2023. He recently had a laceration on his right arm with some metal. Healing well but concerned about potential tetanus exposure, last Tdap was in 2011.    Hypertension: -Medications: Clonidine  0.3 mg, Losartan -hydrochlorothiazide  100-25 mg, Doxazosin  8 mg -Patient is compliant with above medications and reports no side effects. -Checking BP at home (average): doesn't check at home  -Denies any SOB, CP, vision changes, LE edema or symptoms of hypotension  Diabetes, Type 2: -Last A1c 7.0% 4/25 -Medications: Janumet  50-1000 BID, Toujeo  70 units at lunchtime, Fiasp  sliding scale at meals (breakfast 20-30 units, lunch 30 units on average and dinner 40-60 units). Started on Mounjaro  12.5 mg in April (switched from Ozempic ) but has not noticed any appetite suppression or weight loss.  -Patient is compliant with the above medications and  reports no side effects.  -Checking BG at home: average 67-188 -Eye exam: UTD - May 2024 -Foot exam: UTD -Microalbumin: Due -Statin: never -PNA vaccine: Discussed, willing to get booster at follow up -Denies symptoms of hypoglycemia, polyuria, polydipsia, numbness extremities, foot ulcers/trauma.   Vitamin D  Deficiency:  -Currently on Vitamin D  50,000 weekly   Health Maintenance: -Blood work UTD -Colonoscopy in [place] more than 10 years, Cologuard 7/24 negative  -Uncertain vaccine history - has had COVID vaccines except the new one -Flu vaccine today and Tdap today  Outpatient Encounter Medications as of 11/21/2023  Medication Sig   cloNIDine  (CATAPRES ) 0.3 MG tablet Take 1 tablet (0.3 mg total) by mouth daily.   doxazosin  (CARDURA ) 8 MG tablet Take 1 tablet (8 mg total) by mouth daily.   FIASP  FLEXTOUCH 100 UNIT/ML FlexTouch Pen Per sliding scale 120-139's 10 units,140-159's 20 units, >=160's 45 units.   losartan -hydrochlorothiazide  (HYZAAR) 100-25 MG tablet Take 1 tablet by mouth daily.   sitaGLIPtin -metformin  (JANUMET ) 50-1000 MG tablet Take 1 tablet by mouth 2 (two) times daily with a meal.   tirzepatide  (MOUNJARO ) 12.5 MG/0.5ML Pen Inject 12.5 mg into the skin once a week.   TOUJEO  MAX SOLOSTAR 300 UNIT/ML Solostar Pen Inject 70 Units into the skin daily.   Vitamin D , Ergocalciferol , (DRISDOL) 50000 units CAPS capsule Take 50,000 Units by mouth every 7 (seven) days. sundays (Patient not taking: Reported on 11/21/2023)   No facility-administered encounter medications on file as of 11/21/2023.    Past Medical History:  Diagnosis Date  Arthritis    Diabetes mellitus without complication (HCC)    type 2   Dyspnea    with excertion   Headache    rare   Hypertension    Pneumonia    as a child    Past Surgical History:  Procedure Laterality Date   BACK SURGERY     2005   cartarized     bleeding ulcer  2015   JOINT REPLACEMENT     Right total hip 5/29 2018   REPAIR  QUADRICEPS / HAMSTRING MUSCLE     torn loose from knee 2009 5/25   TOTAL HIP ARTHROPLASTY Right 07/13/2016   Procedure: RIGHT TOTAL HIP ARTHROPLASTY ANTERIOR APPROACH;  Surgeon: Ernie Cough, MD;  Location: [facility];  Service: Orthopedics;  Laterality: Right;   TOTAL HIP ARTHROPLASTY Left 11/30/2016   Procedure: LEFT TOTAL HIP ARTHROPLASTY ANTERIOR APPROACH;  Surgeon: Ernie Cough, MD;  Location: [facility];  Service: Orthopedics;  Laterality: Left;  70 mins    Family History  Problem Relation Age of Onset   Diabetes Mother    Dementia Mother     Social History   Socioeconomic History   Marital status: Married    Spouse name: Not on file   Number of children: Not on file   Years of education: Not on file   Highest education level: Not on file  Occupational History   Not on file  Tobacco Use   Smoking status: Never   Smokeless tobacco: Never  Vaping Use   Vaping status: Never Used  Substance and Sexual Activity   Alcohol use: No   Drug use: No   Sexual activity: Yes  Other Topics Concern   Not on file  Social History Narrative   Not on file   Social Drivers of Health   Financial Resource Strain: Not on file  Food Insecurity: Not on file  Transportation Needs: Not on file  Physical Activity: Not on file  Stress: Not on file  Social Connections: Not on file  Intimate Partner Violence: Not on file    Review of Systems  Gastrointestinal:  Negative for abdominal pain, constipation, heartburn, nausea and vomiting.  All other systems reviewed and are negative.       Objective    BP 130/80 (Cuff Size: Large)   Pulse 66   Temp 97.6 F (36.4 C) (Oral)   Resp 16   Ht 5' 11 (1.803 m)   Wt 279 lb 11.2 oz (126.9 kg)   SpO2 99%   BMI 39.01 kg/m   Physical Exam Constitutional:      Appearance: Normal appearance. He is obese.  HENT:     Head: Normocephalic and atraumatic.     Mouth/Throat:     Mouth: Mucous membranes are moist.     Pharynx: Oropharynx is clear.   Eyes:     Extraocular Movements: Extraocular movements intact.     Conjunctiva/sclera: Conjunctivae normal.     Pupils: Pupils are equal, round, and reactive to light.  Neck:     Vascular: No carotid bruit.  Cardiovascular:     Rate and Rhythm: Normal rate and regular rhythm.  Pulmonary:     Effort: Pulmonary effort is normal.     Breath sounds: Normal breath sounds.  Skin:    General: Skin is warm and dry.     Comments: Small laceration on right forearm, no signs of infection  Neurological:     General: No focal deficit present.     Mental  Status: He is alert. Mental status is at baseline.  Psychiatric:        Mood and Affect: Mood normal.        Behavior: Behavior normal.         Assessment & Plan:   Assessment & Plan Type 2 diabetes mellitus with hyperglycemia Blood sugars controlled, A1c improved to 6.3. No GI side effects with Mounjaro  12.5 mg. Increasing to 15 mg, highest available dose. - Increase Mounjaro  to 15 mg for weight. - Monitor blood sugars for hypoglycemia. - Refill Lantus , test strips, needles. - Order urine test for kidney function.  Obesity Weight stable, no appetite changes. Increasing Mounjaro  to 15 mg, no significant weight change expected. - Increase Mounjaro  to 15 mg.  Hypertension Blood pressure controlled at 130/80 mmHg.  Encounter for immunization/Right Forearm Laceration Due for tetanus vaccine after recent laceration. Flu vaccine received. - Administer tetanus vaccine due to laceration on right forearm.   - POCT HgB A1C - Urine Microalbumin w/creat. ratio - tirzepatide  (MOUNJARO ) 15 MG/0.5ML Pen; Inject 15 mg into the skin once a week.  Dispense: 6 mL; Refill: 1 - sitaGLIPtin -metformin  (JANUMET ) 50-1000 MG tablet; Take 1 tablet by mouth 2 (two) times daily with a meal.  Dispense: 180 tablet; Refill: 1 - TOUJEO  MAX SOLOSTAR 300 UNIT/ML Solostar Pen; Inject 70 Units into the skin daily.  Dispense: 7 mL; Refill: 1 - FIASP  FLEXTOUCH 100  UNIT/ML FlexTouch Pen; Per sliding scale 120-139's 10 units,140-159's 20 units, >=160's 45 units.  Dispense: 108 mL; Refill: 1 - cloNIDine  (CATAPRES ) 0.3 MG tablet; Take 1 tablet (0.3 mg total) by mouth daily.  Dispense: 90 tablet; Refill: 1 - doxazosin  (CARDURA ) 8 MG tablet; Take 1 tablet (8 mg total) by mouth daily.  Dispense: 90 tablet; Refill: 1 - losartan -hydrochlorothiazide  (HYZAAR) 100-25 MG tablet; Take 1 tablet by mouth daily.  Dispense: 90 tablet; Refill: 1 - US  Carotid Bilateral; Future - Tdap vaccine greater than or equal to 7yo IM - Flu vaccine HIGH DOSE PF(Fluzone Trivalent)   Return in about 6 months (around 05/21/2024).   Sharyle Fischer, DO

## 2023-11-22 ENCOUNTER — Ambulatory Visit: Payer: Self-pay | Admitting: Internal Medicine

## 2023-11-22 LAB — MICROALBUMIN / CREATININE URINE RATIO
Creatinine, Urine: 31 mg/dL (ref 20–320)
Microalb, Ur: <0.2 mg/dL

## 2023-11-30 ENCOUNTER — Ambulatory Visit

## 2023-12-05 ENCOUNTER — Encounter: Payer: Self-pay | Admitting: Internal Medicine

## 2023-12-08 NOTE — Progress Notes (Signed)
 LYNWOOD KUBISIAK                                          MRN: [id number]   12/08/2023   The [facility] Quality Team Specialist reviewed this patient medical record for the purposes of chart review for care gap closure. The following were reviewed: chart review for care gap closure-diabetic eye exam.    [facility] Quality Team

## 2023-12-15 ENCOUNTER — Other Ambulatory Visit: Payer: Self-pay | Admitting: Internal Medicine

## 2023-12-15 DIAGNOSIS — Z794 Long term (current) use of insulin: Secondary | ICD-10-CM

## 2023-12-16 NOTE — Telephone Encounter (Signed)
 Requested Prescriptions  Pending Prescriptions Disp Refills   TOUJEO  MAX SOLOSTAR 300 UNIT/ML Solostar Pen [Pharmacy Med Name: Toujeo  Max U-300 SoloStar 300 unit/mL (3 mL) subcutaneous insulin  pen] 7 mL 3    Sig: INJECT 70 UNITS SUBCUTANEOUSLY ONCE DAILY     Endocrinology:  Diabetes - Insulins Passed - 12/16/2023  3:17 PM      Passed - HBA1C is between 0 and 7.9 and within 180 days    Hemoglobin A1C  Date Value Ref Range Status  11/21/2023 6.3 (A) 4.0 - 5.6 % Final  08/25/2022 6.8  Final   Hgb A1c MFr Bld  Date Value Ref Range Status  06/14/2023 7.0 (H) <5.7 % Final    Comment:    For someone without known diabetes, a hemoglobin A1c value of 6.5% or greater indicates that they may have  diabetes and this should be confirmed with a follow-up  test. . For someone with known diabetes, a value <7% indicates  that their diabetes is well controlled and a value  greater than or equal to 7% indicates suboptimal  control. A1c targets should be individualized based on  duration of diabetes, age, comorbid conditions, and  other considerations. . Currently, no consensus exists regarding use of hemoglobin A1c for diagnosis of diabetes for children. SABRA Amy - Valid encounter within last 6 months    Recent Outpatient Visits           3 weeks ago Type 2 diabetes mellitus with hyperglycemia, with long-term current use of insulin  [facility])   [place] [facility] Fend, [place]   7 months ago Type 2 diabetes mellitus with hyperglycemia, with long-term current use of insulin  [facility])   [facility] Bernardo Fend, [place]

## 2023-12-19 NOTE — Telephone Encounter (Unsigned)
 Copied from CRM #[id number]. Topic: General - Other >> Dec 16, 2023  3:30 PM Wess RAMAN wrote: Reason for CRM: Reason for CRM: [employer] wanted to follow up on drug therapy alert for statin medication paperwork sent via fax in April and October  Callback #:  [phone]

## 2024-01-20 ENCOUNTER — Other Ambulatory Visit: Payer: Self-pay | Admitting: Internal Medicine

## 2024-01-23 NOTE — Telephone Encounter (Signed)
 Requested medication (s) are due for refill today - unsure  Requested medication (s) are on the active medication list -no  Future visit scheduled -yes  Last refill: 04/05/23  Notes to clinic: no longer listed on current medication list- patient may need with insulin  pens  Requested Prescriptions  Pending Prescriptions Disp Refills   EMBECTA PEN NEEDLE ULTRAFINE 31G X 8 MM MISC [Pharmacy Med Name: Ultra-Fine Pen Needle 31 gauge x 5/16] 200 each 0    Sig: AS DIRECTED FOUR TIMES DAILY     Endocrinology: Diabetes - Testing Supplies Passed - 01/23/2024  2:37 PM      Passed - Valid encounter within last 12 months    Recent Outpatient Visits           2 months ago Type 2 diabetes mellitus with hyperglycemia, with long-term current use of insulin  [facility])   [place] [facility] Bern[facility] Fend, [place]   8 months ago Type 2 diabetes mellitus with hyperglycemia, with long-term current use of insulin  [facility])   [place] [facility] Bernardo Fend, DO                 Requested Prescriptions  Pending Prescriptions Disp Refills   EMBECTA PEN NEEDLE ULTRAFINE 31G X 8 MM MISC [Pharmacy Med Name: Ultra-Fine Pen Needle 31 gauge x 5/16] 200 each 0    Sig: AS DIRECTED FOUR TIMES DAILY     Endocrinology: Diabetes - Testing Supplies Passed - 01/23/2024  2:37 PM      Passed - Valid encounter within last 12 months    Recent Outpatient Visits           2 months ago Type 2 diabetes mellitus with hyperglycemia, with long-term current use of insulin  [facility])   [facility], [place]   8 months ago Type 2 diabetes mellitus with hyperglycemia, with long-term current use of insulin  [facility])   [facility] [place] Fend, [place]

## 2024-02-03 ENCOUNTER — Telehealth: Payer: Self-pay | Admitting: Pharmacy Technician

## 2024-02-03 ENCOUNTER — Other Ambulatory Visit (HOSPITAL_COMMUNITY): Payer: Self-pay

## 2024-02-03 NOTE — Telephone Encounter (Signed)
 Pharmacy Patient Advocate Encounter   Received notification from CoverMyMeds that prior authorization for Mounjaro  10MG /0.5ML auto-injectors is due for renewal.   Insurance verification completed.   The patient is insured through [employer].

## 2024-02-23 ENCOUNTER — Other Ambulatory Visit: Payer: Self-pay | Admitting: Internal Medicine

## 2024-02-23 NOTE — Telephone Encounter (Signed)
 Requested Prescriptions  Pending Prescriptions Disp Refills   ACCU-CHEK GUIDE TEST test strip [Pharmacy Med Name: Accu-Chek Guide test strips] 100 strip 12    Sig: AS DIRECTED     There is no refill protocol information for this order

## 2024-03-08 ENCOUNTER — Other Ambulatory Visit: Payer: Self-pay | Admitting: Internal Medicine

## 2024-03-09 ENCOUNTER — Other Ambulatory Visit: Payer: Self-pay | Admitting: Internal Medicine

## 2024-03-09 NOTE — Telephone Encounter (Signed)
 Requested medications are due for refill today.  Per med list both have been d/c'd, but this is 2nd request.  Requested medications are on the active medications list.  no  Last refill. Accu check 12/24/2022 #100 12 rf, Lancets 04/05/2023 #200 0 rf  Future visit scheduled.   yes  Notes to clinic.  Please review for refill.     Requested Prescriptions  Pending Prescriptions Disp Refills   ACCU-CHEK GUIDE TEST test strip [Pharmacy Med Name: Accu-Chek Guide test strips] 100 strip 12    Sig: AS DIRECTED     There is no refill protocol information for this order     EMBECTA PEN NEEDLE ULTRAFINE 31G X 8 MM MISC [Pharmacy Med Name: Ultra-Fine Pen Needle 31 gauge x 5/16] 200 each 12    Sig: AS DIRECTED FOUR TIMES DAILY     Endocrinology: Diabetes - Testing Supplies Passed - 03/09/2024  2:23 PM      Passed - Valid encounter within last 12 months    Recent Outpatient Visits           3 months ago Type 2 diabetes mellitus with hyperglycemia, with long-term current use of insulin  [facility])   [place] [facility] Bern[facility]end, [place]   9 months ago Type 2 diabetes mellitus with hyperglycemia, with long-term current use of insulin  [facility])   [facility] [place] Fend, [place]

## 2024-03-09 NOTE — Telephone Encounter (Signed)
 Requested Prescriptions  Refused Prescriptions Disp Refills   ACCU-CHEK GUIDE TEST test strip [Pharmacy Med Name: Accu-Chek Guide test strips] 100 strip 12    Sig: AS DIRECTED     There is no refill protocol information for this order     EMBECTA PEN NEEDLE ULTRAFINE 31G X 8 MM MISC [Pharmacy Med Name: Ultra-Fine Pen Needle 31 gauge x 5/16] 200 each 12    Sig: AS DIRECTED FOUR TIMES DAILY     Endocrinology: Diabetes - Testing Supplies Passed - 03/09/2024 10:14 AM      Passed - Valid encounter within last 12 months    Recent Outpatient Visits           3 months ago Type 2 diabetes mellitus with hyperglycemia, with long-term current use of insulin  [facility])   [place] [facility] Bernardo Fend, DO   9 months ago Type 2 diabetes mellitus with hyperglycemia, with long-term current use of insulin  [facility])   [facility] Bernardo Fend, [place]

## 2024-04-20 ENCOUNTER — Ambulatory Visit: Admitting: Internal Medicine

## 2024-04-24 ENCOUNTER — Ambulatory Visit: Admitting: Internal Medicine
